# Patient Record
Sex: Female | Born: 1950 | Race: White | Hispanic: No | Smoking: Current every day smoker
Health system: Southern US, Community
[De-identification: ages and names within clinical notes are randomized; demographics above are authoritative.]

## PROBLEM LIST (undated history)

## (undated) DIAGNOSIS — J449 Chronic obstructive pulmonary disease, unspecified: Secondary | ICD-10-CM

## (undated) DIAGNOSIS — M797 Fibromyalgia: Secondary | ICD-10-CM

## (undated) DIAGNOSIS — E785 Hyperlipidemia, unspecified: Secondary | ICD-10-CM

## (undated) DIAGNOSIS — C801 Malignant (primary) neoplasm, unspecified: Secondary | ICD-10-CM

## (undated) DIAGNOSIS — I10 Essential (primary) hypertension: Secondary | ICD-10-CM

## (undated) DIAGNOSIS — F419 Anxiety disorder, unspecified: Secondary | ICD-10-CM

## (undated) HISTORY — DX: Fibromyalgia: M79.7

## (undated) HISTORY — PX: TONSILLECTOMY: SUR1361

## (undated) HISTORY — PX: BACK SURGERY: SHX140

## (undated) HISTORY — PX: MASTECTOMY: SHX3

## (undated) HISTORY — PX: BREAST SURGERY: SHX581

## (undated) HISTORY — PX: ABDOMINAL HYSTERECTOMY: SHX81

---

## 1998-06-23 ENCOUNTER — Emergency Department (HOSPITAL_COMMUNITY): Admission: EM | Admit: 1998-06-23 | Discharge: 1998-06-23 | Payer: Self-pay | Admitting: Emergency Medicine

## 1999-09-25 ENCOUNTER — Encounter: Admission: RE | Admit: 1999-09-25 | Discharge: 1999-09-25 | Payer: Self-pay | Admitting: General Practice

## 1999-09-25 ENCOUNTER — Encounter: Payer: Self-pay | Admitting: General Practice

## 2000-02-08 ENCOUNTER — Emergency Department (HOSPITAL_COMMUNITY): Admission: EM | Admit: 2000-02-08 | Discharge: 2000-02-08 | Payer: Self-pay | Admitting: *Deleted

## 2000-03-12 ENCOUNTER — Encounter: Admission: RE | Admit: 2000-03-12 | Discharge: 2000-03-12 | Payer: Self-pay | Admitting: Plastic Surgery

## 2000-03-12 ENCOUNTER — Encounter: Payer: Self-pay | Admitting: Plastic Surgery

## 2001-12-09 ENCOUNTER — Ambulatory Visit (HOSPITAL_COMMUNITY): Admission: RE | Admit: 2001-12-09 | Discharge: 2001-12-09 | Payer: Self-pay | Admitting: Anesthesiology

## 2001-12-09 ENCOUNTER — Encounter: Payer: Self-pay | Admitting: Anesthesiology

## 2003-08-31 ENCOUNTER — Encounter: Admission: RE | Admit: 2003-08-31 | Discharge: 2003-11-29 | Payer: Self-pay | Admitting: Anesthesiology

## 2003-12-19 ENCOUNTER — Encounter
Admission: RE | Admit: 2003-12-19 | Discharge: 2004-03-18 | Payer: Self-pay | Admitting: Physical Medicine and Rehabilitation

## 2004-01-17 ENCOUNTER — Inpatient Hospital Stay (HOSPITAL_COMMUNITY): Admission: RE | Admit: 2004-01-17 | Discharge: 2004-01-18 | Payer: Self-pay | Admitting: Orthopaedic Surgery

## 2006-09-14 ENCOUNTER — Other Ambulatory Visit: Admission: RE | Admit: 2006-09-14 | Discharge: 2006-09-14 | Payer: Self-pay | Admitting: Radiology

## 2008-07-14 ENCOUNTER — Emergency Department (HOSPITAL_COMMUNITY): Admission: EM | Admit: 2008-07-14 | Discharge: 2008-07-14 | Payer: Self-pay | Admitting: Emergency Medicine

## 2009-08-20 ENCOUNTER — Encounter: Payer: Self-pay | Admitting: Gastroenterology

## 2009-08-22 ENCOUNTER — Encounter: Payer: Self-pay | Admitting: Gastroenterology

## 2009-08-29 ENCOUNTER — Ambulatory Visit (HOSPITAL_COMMUNITY): Admission: RE | Admit: 2009-08-29 | Discharge: 2009-08-29 | Payer: Self-pay | Admitting: Gastroenterology

## 2009-08-29 ENCOUNTER — Ambulatory Visit: Payer: Self-pay | Admitting: Gastroenterology

## 2009-08-30 ENCOUNTER — Telehealth: Payer: Self-pay | Admitting: Gastroenterology

## 2010-02-13 ENCOUNTER — Encounter: Admission: RE | Admit: 2010-02-13 | Discharge: 2010-02-13 | Payer: Self-pay | Admitting: Family Medicine

## 2010-02-13 ENCOUNTER — Encounter: Admission: RE | Admit: 2010-02-13 | Discharge: 2010-02-13 | Payer: Self-pay | Admitting: Nephrology

## 2010-09-16 NOTE — Letter (Signed)
Summary: Internal Other/triage/instructions  Internal Other/triage/instructions   Imported By: Cloria Spring LPN 16/05/9603 54:09:81  _____________________________________________________________________  External Attachment:    Type:   Image     Comment:   External Document

## 2010-09-16 NOTE — Progress Notes (Signed)
Summary: Upper shoulder pain since TCS yesterday  Phone Note Call from Patient   Caller: Patient Summary of Call: pt called and said she had TCS yesterday. Later in the day yesterday  she had upper shoulder pain and all day today she has had the pain. When she leans back she describes the pain as excruciating. I told her I will let Dr. Darrick Penna know,  but if it is Excruciating, she might want to go on to the hospital. Her Number is [phone]. Initial call taken by: Cloria Spring LPN,  August 30, 2009 3:30 PM     Appended Document: Upper shoulder pain since TCS yesterday Pt instructed to go to ED.

## 2010-09-16 NOTE — Letter (Signed)
Summary: Internal Other/triage  Internal Other/triage   Imported By: Cloria Spring LPN 16/05/9603 54:09:81  _____________________________________________________________________  External Attachment:    Type:   Image     Comment:   External Document  Appended Document: Internal Other/triage Please call pt. TRILYTE prep. Needs propofol on Remeron, Cymbalta, and Xanax.  Appended Document: Internal Other/triage Pt is scheduled on 08/29/09 in the or

## 2010-11-02 LAB — BASIC METABOLIC PANEL
Calcium: 9.7 mg/dL (ref 8.4–10.5)
Chloride: 100 mEq/L (ref 96–112)
Creatinine, Ser: 0.84 mg/dL (ref 0.4–1.2)
GFR calc Af Amer: >60 mL/min (ref >60)
GFR calc non Af Amer: >60 mL/min (ref >60)
Glucose, Bld: 99 mg/dL (ref 70–99)
Potassium: 4.5 mEq/L (ref 3.5–5.1)
Sodium: 137 mEq/L (ref 135–145)

## 2010-11-02 LAB — HEMOGLOBIN AND HEMATOCRIT, BLOOD: Hemoglobin: 14.9 g/dL (ref 12.0–15.0)

## 2011-01-02 NOTE — Group Therapy Note (Signed)
Allison Park [facility] today as a referral  from Dr. Andi Devon.  She comes with records which I reviewed, imaging reports  which I reviewed.  She is complaining of cervical pain and paralumbar  discomfort.  She has been treated by Dr. Vear Clock, an exceptional pain  management physician in the community with adequate function and quality of  life indices, whom had been treating her with OxyContin, Soma in a  multimodality sense.  He tried interventional procedures, and these  medications, and she stated that she did not improve function and quality of  life indices.  She did, however, state the OxyContin helped with some of her  pain.  She relates a significantly impaired lifestyle secondary to  depression and her pain.  She is followed by a psychiatrist, Dr. Senaida Ores,  for bipolar disease.  She relates no suicidal ideation.  She relates no risk  to harm self or others, and she relates no previous substance abuse history.  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  She is also  taking Xanax for symptom control.  She does have some panic.  Her pain is an  8/10 subjective scale, dull, aching, throbbing, mostly 10/10.  She relates a  fairly sedentary lifestyle.  No overall neurological deficit.  She also  relates that she has been seen by Neurosurgery and been suggested as  nonsurgical.  She is an individual who has tired physical therapy and other  technologies as well which have not helped her.  She has not had any pain  medications in 8-10 days.  She had no signs of withdrawal.  Of note, she had  some type of problem in Dr. Berton Lan office, so I will try to call him.  She had, by last note, a drug screen which she states was fine.  We will try  to get that result.  Apparently, there was something about a missed  appointment, and he discharged her.   She states no temporal  relation.  Today, her pain is constant, dull, aching  and throbbing.  It is not improved with most activities.  It is made worse  with sitting, working, bending and walking.  She has not worked since  January of 1996.   MEDICATIONS:  1. Xanax.  2. Seroquel.  3. Lexapro.  4. Hydrochlorothiazide.  5. OxyContin.  6. Soma.   ALLERGIES:  Described as more intolerance to MORPHINE and other medications  that have been used for pain, and she is unclear of that inventory.  Tramadol she has taken and Darvocet she has taken.   PAST SURGICAL HISTORY:  1. Breast cancer surgery x7.  2. Total abdominal hysterectomy.  She is under the observation of Dr. Odis Luster     for her cancer.  She has no recurrence.  She has had MRI and x-rays.   SOCIAL HISTORY:  Remarkable for cigarette smoking, one half pack per day x5  years.  I cautioned.  She does not use alcohol.  She is married.   REVIEW OF SYSTEMS/FAMILY HISTORY/SOCIAL HISTORY:  Otherwise, noncontributory  to the pain problem.   PHYSICAL EXAMINATION:  GENERAL APPEARANCE:  A pleasant female, anxious,  appearing somewhat flat in affect, almost drugged.  Slow to respond.  Oriented x3.  Gait, affect, appearance, otherwise, unremarkable.  HEENT:  Unremarkable.  CHEST:  Clear to auscultation and percussion.  Regular rate and rhythm  without murmurs, rubs or gallops.  ABDOMEN:  Soft, nontender, benign.  No hepatosplenomegaly.  NEUROLOGICAL:  She has diffuse suprascapular, paracervical and paralumbar  myofascial discomfort.  Pain over PSI and notable pain on extension.  Gaenslen's and Patrick's equivocal.  She has no other overt neurological  deficits in motor, sensory and reflexes.   IMPRESSION:  1. Cervicalgia.  2. Degenerative spinal disease, cervical spine.  3. Degenerative spinal disease, lumbar spine.   PLAN:  1. She has been out of her medication for 8 days without decline in     function, quality of life indices, evidence of withdrawal or  other     functional impairment.  At this time, I am not going to reinitiate Soma     and will give her Ultracet for pain control with an occasional Wygesic     for breakthrough.  I will monitor usage patterns in appropriateness to     the patient's agreement.  2. Next visit will perform medical drug screen urine test.  I think she was     pretty much expecting it today, and this is with full informed consent,     and she understands we will be doing this from time to time.  She does     understand the patient care agreement.  3. Other lifestyle enhancement such as cigarette cessation, home based     therapy and increase in lifestyle activities are recommended.  4. Instructed to maintain contact with Dr. Senaida Ores.  5. Will follow up with her.  Will have our physiatry colleagues take a look     at her.  I would like to avoid controlled substances if possible.   This ends the consultation, discharge instructions given.  No barriers to  communication.     Celene Kras, MD   HH/MedQ  D:  09/04/2003 11:43:24  T:  09/04/2003 14:22:19  Job #:  [id number]

## 2011-01-02 NOTE — Assessment & Plan Note (Signed)
Allison Park is a 60 year old woman who I am seeing for the second time in  this clinic.  She was first seen October 24, 2003.  She has a history of  cervicalgia, bipolar disorder, a history of breast cancer, mild right  rotator cuff problem, low back pain and history of myofascial pain.   She has been treated with tramadol, Wygesic, and Soma; she feels that this  has not been that helpful for.  She also takes Skelaxin 800 t.i.d.   She reports that over the last 2-3 months she has developed this right  shoulder and upper extremity pain and in the last 4 weeks it has gotten much  worse for her.  She reports pain in her right shoulder and that goes down  her arm as well as into the right shoulder blade area.   She reports the right shoulder pain is worse in the morning, it is about a  10 on a scale of 0-10 and as the day goes on it lessens and at night when  she is resting she has absolutely no pain at all - a 0 at night.  She  reports some right hand tingling, especially in the morning.  She denies any  bowel or bladder problems, any gait abnormalities.   She remains rather functional.  She is independent with all of her  activities of daily living and self-care and in fact she does the laundry  and does meal prep and some grocery shopping.   On exam she is alert, oriented as well as cooperative and appropriate.   Blood pressure is 115/77, pulse 75, respirations 16, 99% saturated on room  air.  Her heart is in regular rhythm.  Lungs are clear.  Abdomen benign.   She has functional range of motion at her shoulders.  She has some mild to  moderate limitations with her cervical range.  Her reflexes are intact in  the upper and lower extremities, 2+ at the biceps, triceps, and  brachioradialis, 2+ at the knees and ankles bilaterally, toes are downgoing,  no clonus is noted.  She has some increased pain to pinprick over the right  C5 dermatome, sensation is otherwise intact throughout both  upper and lower  extremities.   IMPRESSION:  1. Cervicalgia.  2. Right upper extremity radicular symptoms.  3. History of bipolar disorder.  4. History of breast cancer.  5. Low back pain.  6. History of myofascial pain.   PLAN:  We will give her Norco 10/325 one p.o. every morning (#30).  We will  also give her something to help her rest at night.  She says the Tresa Garter has  been very helpful for her in the past, we will give her 350 mg one p.o. at  bedtime (#30).  She tells me she is planning to follow up with Sharolyn Douglas and  that she will be seeing him within the next week.  Would consider either  flexion and extension films or possibly an MRI, apparently it has been a  while  since she had repeat cervical scanning however, since she is going to be  following up with Dr. Noel Gerold we will hold off and wait for his assessment on  her.  We will see her back in 1 month.      Brantley Stage, M.D.   DMK/MedQ  D:  11/23/2003 17:54:09  T:  11/24/2003 20:16:41  Job #:  [id number]   cc:   Sharolyn Douglas, M.D.  [phone]  [address]  [place]  [place] [address]  Fax: [phone]   Gabriel Earing, M.D.  [address]  [place]  [place] [address]  Fax: [phone]

## 2011-01-02 NOTE — H&P (Signed)
NAME:  Allison Park, Allison Park                          ACCOUNT NO.:  [id number]   MEDICAL RECORD NO.:  [id number]                   PATIENT TYPE:  INP   LOCATION:  NA                                   FACILITY:  [facility]   PHYSICIAN:  Sharolyn Douglas, M.D.                     DATE OF BIRTH:  Feb 07, 1951   DATE OF ADMISSION:  01/17/2004  DATE OF DISCHARGE:                                HISTORY & PHYSICAL   CHIEF COMPLAINT:  Pain in my neck and right arm.   PRESENT ILLNESS:  This 60 year old lady has had pain in her neck and right  upper extremity going on since the early part of this year, in February.  The pain is primarily into the posterior shoulder in the peri-scapular area.  It radiates down into the right arm and hand.  She has aching, numbness and  burning.  Exercise exacerbates her discomfort, as well as coughing and  sneezing.  She only has minimal relief of discomfort with analgesics and  muscle relaxants.  This patient has had multiple surgeries in the past and  is very reticent to have any surgery, however, she is essentially at her  wit's end as far as her discomfort.  She is being [facility].  Conservative  treatment has not helped her overall discomfort.  It was felt she would  benefit from surgical intervention and is being admitted for anterior  cervical diskectomy and fusion,  C5-C6, C6-C7, with allograft and plate.  MRIs have shown cervical spondylitic changes; this is mostly seen at C5-C6.  Broad-based disk protrusion is seen there at this same level with stenosis.  C6-C7 has shown stenosis as well and foraminal disk protrusion to the right.   PAST MEDICAL HISTORY:  This lady has been in relatively good health.  She  does have severe migraine headaches and anxiety and depression as well.  She  also has hypertension.   PAST SURGICAL HISTORY:  Past surgeries include mastectomies in 1990 and 1991  with  reconstruction, breast infections in 1996 and 1997 and final breast  reconstructions in 1998 and 1999.   SOCIAL HISTORY:  The patient is married.  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  Her husband will be her care-giver after surgery.   FAMILY HISTORY:  Family history is positive for hypertension, arthritis and  pre-cancerous cells.   REVIEW OF SYSTEMS:  CNS:  No seizure disorder, paralysis or double vision,  but the patient has a radiculitis into the right upper extremity as  mentioned above.  CARDIOVASCULAR:  No chest pain, no angina, no orthopnea.  RESPIRATORY:  No productive cough, no hemoptysis, no shortness of breath.  GASTROINTESTINAL:  No nausea, vomiting, melena or bloody stools.  GENITOURINARY:  No discharge, dysuria or hematuria.  MUSCULOSKELETAL:  Primarily in present illness.   PHYSICAL EXAMINATION:  GENERAL/VITAL SIGNS:  Alert, cooperative, friendly,  somewhat apprehensive 60 year old white female whose vital signs are:  Blood  pressure 118/82, pulse 90, respirations are 12.  HEENT:  Normocephalic.  PERRLA; wears glasses.  Oropharynx is clear.  CHEST:  The chest was clear to auscultation, no rhonchi, no rales.  HEART:  Heart with regular rate and rhythm.  No murmurs are heard.  ABDOMEN:  Abdomen is soft and nontender, liver and spleen not felt.  GENITAL, RECTAL, PELVIC, BREASTS:  Not done, not pertinent to present  illness.  EXTREMITIES:  She has decreased sensation in the right upper extremity in a  C6-7 distribution.  Strength, 4/5, is seen in the biceps.   ADMITTING DIAGNOSES:  1. Cervical spondylitic radiculopathy.  2. Anxiety and depression.  3. Hypertension.   PLAN:  The patient will be admitted for anterior cervical disk fusion at C5-  C6 and C6-C7 with allograft and plate.      Dooley L. Cherlynn June.                 Sharolyn Douglas, M.D.    DLU/MEDQ  D:  01/09/2004  T:  01/10/2004  Job:   [id number]   cc:   Estell Harpin, M.D.  [address]  [place] [address]  Fax: [phone]   Brantley Stage, M.D.  Fax: [phone]

## 2011-01-02 NOTE — Op Note (Signed)
NAMEAMORA, SHEEHY                          ACCOUNT NO.:  [id number]   MEDICAL RECORD NO.:  [id number]                   PATIENT TYPE:  INP   LOCATION:  2870                                 FACILITY:  [facility]   PHYSICIAN:  Sharolyn Douglas, M.D.                     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   PROCEDURE:  1. Anterior cervical diskectomy C5-6 and C6-7.  2. Anterior cervical arthrodesis C5-6 and C6-7, with placement of two     allograft prosthesis spacers packed with local autogenous bone graft.  3. Anterior cervical plating at C5 though C7, utilizing the Spinal Concept     System.   SURGEON:  Sharolyn Douglas, M.D.   ASSISTANT:  Verlin Fester, P.A.   ANESTHESIA:  General endotracheal.   COMPLICATIONS:  None.   INDICATIONS FOR PROCEDURE:  The patient is a 60 year old female with a  history of severe neck and right upper extremity pain, thought to be  secondary to cervical spondylitic radiculopathy.  Plain radiographs showed  degenerative changes, most severe at C5-6 and C6-7 with loss of the normal  lordosis.  An MRI scan shows moderate-sized broad-based disk protrusion and  spinal stenosis at C5-6 with foraminal narrowing, right greater than left.  At C6-7 there is moderate spinal stenosis with a focal right foraminal disk  protrusion with right-sided foraminal narrowing.  She has milder  degenerative changes at C4-5, with a small central disk bulge.  Because of  her persistent symptoms, unresponsive to all conservative care, she has  elected to undergo an ACDF at C5-6, C6-7, in hopes of improving her  symptoms.  She knows the risks of adjacent segment degeneration requiring  additional surgery.   DESCRIPTION OF PROCEDURE:  The patient is properly identified in the holding  area and taken to the operating  room.  She underwent general endotracheal  anesthesia without difficulty.  She was given prophylactic antibiotics.  She  was carefully positioned supine on the operating room table with the  Mayfield headrest.  Her neck was placed in slight extension with 5 pounds of  Holter traction placed.  The neck was prepped and draped in the usual  sterile fashion.  A 4.0 cm incision was made on the left side in a  transverse fashion at the level of the cricoid cartilage.  Dissection was  carried sharply through the platysma.  The interval between the SCM and the  strap muscles medially was developed down the prevertebral space.  The  esophagus, trachea and carotid sheath were identified and protected at all  times.  A spinal needle was placed in the C6-7 interspace.  X-ray taken to  confirm this location.  The longus coli muscle was  elevated out over the C5-  6 and C6-7 disk spaces bilaterally.  A deep ShadowLine retractor was placed.  A sharp annulotomy was carried out.  The diskectomy completed back to the  posterior longitudinal ligament.  We found the disk to be degenerative at  both levels.  There were uncovertebral spurs at C5-6, greater than C6-7.  Caspar distraction pins were placed in the C5, C6 and C7 vertebral bodies.  The microscope was draped and brought into the field.  A high-speed bur was  used to remove the cartilaginous end plates and take down the uncovertebral  spurs and posterior vertebral margins.  A 2.0 mm Kerrison punch was used to  undercut the vertebral margins.  Complete wide foraminotomies bilaterally at  both levels.  A disk herniation was removed from the spinal canal and C6-7  foramen on the right side.  We felt like we had a good decompression of the  thecal sac and nerve roots bilaterally at both levels.  We then placed a 7.0  mm invasive allograft prosthesis spacer packed with local autogenous bone  graft collected from the bur shavings at C5-6.  This was carefully   countersunk 1.0 mm.  An 8.0 mm graft placed at C6-7, again countersunk 1.0  mm and packed with local autogenous bone graft.  A 42.0 mm Spinal Concept  anterior cervical plate placed with six 12.0 mm screws.  We had good screw  purchase.  The locking mechanism was engaged.  The wound was irrigated.  The  esophagus, trachea and carotid sheath were inspected.  There were no  apparent injuries.  Bleeding was controlled with bipolar electrocautery,  Gelfoam, and a deep Penrose drain left in place.  The platysma was closed  with #2-0 interrupted Vicryl, the subcutaneous layer closed with #3-0  Vicryl, followed by a running #4-0 subcuticular suture to approximate the  skin edges.  Benzoin and Steri-Strips placed.  A soft collar applied.  The patient was extubated without difficulty and transferred to the recovery  room in stable condition, able to move her upper and lower extremities.                                               Sharolyn Douglas, M.D.    MC/MEDQ  D:  01/17/2004  T:  01/17/2004  Job:  [id number]

## 2011-01-02 NOTE — Group Therapy Note (Signed)
CHIEF COMPLAINT:  Neck, back, and right shoulder pain.   This is a 60 year old woman who has a history of neck pain dating back to  1998 and recent onset of low back pain dating back to about a year ago and  right shoulder pain.  She has a previous history of a rotator cuff injury.  She reports that the neck, back, and shoulder are all approximately a 10 on  a scale of 0-10.  It can go down to approximately a 7.  She has been seen in  the past by Dr. Vear Clock who took care of her pain management over the last  year.  She has also seen Dr. Stevphen Rochester who has recently seen her on September 04, 2003.   She is also following up with Dr. Senaida Ores who is her psychiatrist and  takes care of her for her bipolar disease.   Her pain is typically exacerbated by increased activity.  Her right shoulder  flared up when she was doing the laundry.  She has flare ups in the right  shoulder approximately every two to three months and they usually last  approximately a week or so.  Her neck and low back pain are exacerbated by  activities as well.  Apparently she has been told by a previous physician  that she should not do any kind of exercise and that any kind of exercise  would be detrimental to her condition.   She denies any problems with weakness.  She denies any bowel or bladder  symptoms.  She denies any persistent numbness and tingling.  Occasionally  she gets some tingling and numbness into the left lower extremity.  It is  not persistent.  It happens a couple of times a week.  She has no problems  with clumsiness or falls.  She describes the pain as achy and dull in the  neck and the mid back area.  She also mentions briefly that her knees do  bother her as well.   She apparently has some previous MRI scans of her neck, back, and right  shoulder.  I do not have any information regarding these.  She tells me they  have been done within the last year.   FUNCTIONAL HISTORY:  The patient is  independent with all of her activities  of daily living and mobility.   REVIEW OF SYSTEMS:  The nursing assessment health and history form are  reviewed.  The patient denies any cardiovascular or respiratory problems and  any gastrointestinal or genitourinary problems.  Denies any constitutional  problems, such as fevers, chills, weight loss, and swelling.   EMOTIONAL STATUS:  The patient currently reports she is doing well.  She is  followed up by Leone Haven who is her psychiatrist.  She has been  seeing her approximately one year and she takes care of her for her  depression and bipolar disease.   She is married.  She denies alcohol use.  Denies illicit drug use.  She  smokes a packs of cigarettes per day.  She also has two dogs which she  enjoys taking care of.   Her mom is deceased at age 44 with rheumatic heart disease.  Father alive at  74 with history of ulcers, otherwise healthy.  One brother, 11, currently  healthy.   ALLERGIES:  PENICILLIN.   MEDICATIONS:  1. Hydrochlorothiazide one daily.  2. Lexapro one daily.  3. Seroquel one-half b.i.d.  4. Xanax 60 mg q.i.d.  5.  Skelaxin 800 mg one t.i.d.  6. Tramadol 50 mg one to two q.i.d.  7. Lidoderm 5%.  8. Wygesic one b.i.d.   PAST MEDICAL HISTORY:  Remarkable for:  1. Depression.  2. Bipolar disease.  3. Breast cancer, status post breast implants and reconstruction.  4. Low back pain.  5. Cervicalgia.  6. Rotator cuff problems.   PAST SURGICAL HISTORY:  1. Breast cancer surgery x 7.  2. Total abdominal hysterectomy.   The patient smokes a packs of cigarettes per day.  Denies illicit drug use.  Denies alcohol use.   PHYSICAL EXAMINATION:  VITAL SIGNS:  On exam today, the blood pressure is  126/80, pulse 99, respirations 16, 96% saturated on room air.  GENERAL APPEARANCE:  She was a pleasant, well-developed, well-nourished  woman in no apparent distress.  Somewhat flat in her affect, but cooperative  and  appropriate.  HEENT:  Unremarkable.  The face was symmetric.  NECK:  No adenopathy noted.  Some limitations in range of flexion,  extension, and lateral flexion, as well as rotation.  The patient noticed  increased discomfort, especially with extension.  HEART:  With regular rhythm.  LUNGS:  Clear overall with initially a couple of wheezes which cleared.  ABDOMEN:  Benign.  Bowel sounds are positive.  NEUROMUSCULAR:  Cranial nerves are grossly intact.  Sensory exam does not  reveal any deficits with light touch.  Vibratory and position sense were  intact.  Romberg's test was normal.  Her gait was normal in the room.  She  had some limitations with forward flexion, extension, and lateral flexion.  All increased pain in her lower lumbar area.  Reflexes in the upper and  lower extremities were 2+ and symmetric at biceps, triceps, brachial  radialis, patellar, and Achilles' tendons.  Toes are downgoing to the  plantar response.  No clonus was noted.  She had full shoulder range of  motion bilaterally.  She did report increased pain in the right shoulder  with active range of motion.  Her passive range of motion, as well as active  was quite functional.  Motor strength in both upper and lower extremities  was in the 5/5 range.  There was really no focal weakness noted.  She had  tenderness to palpation in the lower cervical paraspinal musculature, as  well as in the lumbar paraspinal musculature.  She had some mild bicipital  tendon tenderness as well.   ASSESSMENT:  1. Cervicalgia.  2. Mild right shoulder rotator cuff tendonitis.  3. Low back pain.  4. Cervical and lumbar spondylosis.  5. Myofascial pain.  6. Depression.  7. Bipolar disease.  8. History of breast cancer.   PLAN:  Will continue using tramadol, Lidoderm, and Wygesic.  Will continue  to avoid stronger opioids.  She was given a two-week supply of Soma which she can take once a night to help her relax.  Her shoulder has  been  bothering her somewhat.  She found that in the past Soma was quite effective  in helping her rest.  We will not plan on keeping her on this medication for  any long duration.  She was also given Lidoderm samples and no prescriptions  other than the Soma one p.o. q.h.s. p.r.n., #14.  That was the only  prescription written today.  She was also given information regarding the  fibromyalgia and arthritis pool programs at the [facility].  She said she  would be interested in engaging in this particular  activity.     Brantley Stage, M.D.   DMK/MedQ  D:  10/24/2003 14:59:14  T:  10/24/2003 16:20:18  Job #:  [id number]   cc:   Gabriel Earing, M.D.  [address]  [place]  [place] [address]  Fax: [phone]

## 2012-06-17 ENCOUNTER — Ambulatory Visit
Admission: RE | Admit: 2012-06-17 | Discharge: 2012-06-17 | Disposition: A | Discharge status: Home / Self Care | Payer: 59 | Source: Ambulatory Visit | Attending: Nephrology | Admitting: Nephrology

## 2012-06-17 ENCOUNTER — Other Ambulatory Visit: Payer: Self-pay | Admitting: Nephrology

## 2012-07-07 ENCOUNTER — Encounter (HOSPITAL_COMMUNITY): Payer: Self-pay | Admitting: *Deleted

## 2012-07-07 ENCOUNTER — Emergency Department (HOSPITAL_COMMUNITY)
Admission: EM | Admit: 2012-07-07 | Discharge: 2012-07-07 | Disposition: A | Discharge status: Home / Self Care | Payer: 59 | Attending: Emergency Medicine | Admitting: Emergency Medicine

## 2012-07-07 DIAGNOSIS — L0291 Cutaneous abscess, unspecified: Secondary | ICD-10-CM

## 2012-07-07 DIAGNOSIS — I10 Essential (primary) hypertension: Secondary | ICD-10-CM | POA: Insufficient documentation

## 2012-07-07 DIAGNOSIS — Z79899 Other long term (current) drug therapy: Secondary | ICD-10-CM | POA: Insufficient documentation

## 2012-07-07 DIAGNOSIS — F17200 Nicotine dependence, unspecified, uncomplicated: Secondary | ICD-10-CM | POA: Insufficient documentation

## 2012-07-07 DIAGNOSIS — N764 Abscess of vulva: Secondary | ICD-10-CM | POA: Insufficient documentation

## 2012-07-07 DIAGNOSIS — Z853 Personal history of malignant neoplasm of breast: Secondary | ICD-10-CM | POA: Insufficient documentation

## 2012-07-07 HISTORY — DX: Malignant (primary) neoplasm, unspecified: C80.1

## 2012-07-07 HISTORY — DX: Essential (primary) hypertension: I10

## 2012-07-07 LAB — GLUCOSE, CAPILLARY: Glucose-Capillary: 95 mg/dL (ref 70–99)

## 2012-07-07 MED ORDER — DOXYCYCLINE HYCLATE 100 MG PO CAPS: 100.0000 mg | ORAL_CAPSULE | Freq: Two times a day (BID) | ORAL | Status: DC

## 2012-07-07 MED ORDER — OXYCODONE-ACETAMINOPHEN 5-325 MG PO TABS
1.0000 | ORAL_TABLET | Freq: Once | ORAL | Status: AC
  Administered 2012-07-07: 1 via ORAL
  Filled 2012-07-07: qty 1

## 2012-07-07 NOTE — ED Provider Notes (Signed)
History     CSN: [id number]  Arrival date & time 07/07/12  1353   First MD Initiated Contact with Patient 07/07/12 1423      Chief Complaint  Patient presents with  . Abscess     Patient is a 61 y.o. female presenting with abscess. The history is provided by the patient.  Abscess  This is a recurrent problem. The current episode started more than one week ago. The onset was gradual. The problem occurs frequently. The problem has been gradually improving. Affected Location: left labia. Pertinent negatives include no fever and no vomiting.  pt reports she has had abscess in left labia for past month.  She reports she "popped" it earlier this month but still reports swelling in the area.  She saw her PCP last week who advised ER evaluation but did not come in until today   No fever/vomiting.  No abd pain.  No h/o diabetes  Past Medical History  Diagnosis Date  . Hypertension   . Cancer     Past Surgical History  Procedure Date  . Breast surgery   . Mastectomy     No family history on file.  History  Substance Use Topics  . Smoking status: Current Every Day Smoker -- 0.5 packs/day    Types: Cigarettes  . Smokeless tobacco: Not on file  . Alcohol Use: No    OB History    Grav Para Term Preterm Abortions TAB SAB Ect Mult Living                  Review of Systems  Constitutional: Negative for fever.  Gastrointestinal: Negative for vomiting.    Allergies  Penicillins  Home Medications   Current Outpatient Rx  Name  Route  Sig  Dispense  Refill  . ALPRAZOLAM 1 MG PO TABS   Oral   Take 1 mg by mouth 3 (three) times daily as needed. Anxiety.         Marland Kitchen FLUTICASONE-SALMETEROL 500-50 MCG/DOSE IN AEPB   Inhalation   Inhale 1 puff into the lungs every 12 (twelve) hours.         Marland Kitchen HYDROCHLOROTHIAZIDE 25 MG PO TABS   Oral   Take 25 mg by mouth daily.         Marland Kitchen NAPROXEN SODIUM 220 MG PO TABS   Oral   Take 220 mg by mouth 2 (two) times daily as needed.  Headache.         Marland Kitchen SIMVASTATIN 40 MG PO TABS   Oral   Take 40 mg by mouth every evening.         Marland Kitchen TEMAZEPAM 15 MG PO CAPS   Oral   Take 15 mg by mouth at bedtime as needed. Sleep.         . VENLAFAXINE HCL ER 150 MG PO CP24   Oral   Take 150 mg by mouth daily.         Marland Kitchen DOXYCYCLINE HYCLATE 100 MG PO CAPS   Oral   Take 1 capsule (100 mg total) by mouth 2 (two) times daily.   10 capsule   0     BP 129/92  Pulse 95  Temp 97.5 F (36.4 C) (Oral)  Resp 18  Ht 5\' 4"  (1.626 m)  Wt 140 lb (63.504 kg)  BMI 24.03 kg/m2  SpO2 97%  Physical Exam CONSTITUTIONAL: Well developed/well nourished HEAD AND FACE: Normocephalic/atraumatic EYES: EOMI/PERRL ENMT: Mucous membranes moist NECK: supple no meningeal signs  CV: S1/S2 noted, no murmurs/rubs/gallops noted LUNGS: Lungs are clear to auscultation bilaterally, no apparent distress ABDOMEN: soft, nontender, no rebound or guarding GU:no cva tenderness. Lateral to left labia is a small area of erythema but no fluctuance.  Small amt of induration.  Minimal tenderness noted.  No other abscesses noted.  No vaginal fistula.  Chaperone present NEURO: Pt is awake/alert, moves all extremitiesx4 EXTREMITIES: pulses normal, full ROM SKIN: warm, color normal PSYCH: no abnormalities of mood noted  ED Course  Procedures   Area is already healing and does not require any drainage.  Will start short course of doxycycline since she has small amt of residual erythema and induration.  One dose of pain meds given here.   Labs Reviewed  GLUCOSE, CAPILLARY     1. Abscess       MDM  Nursing notes including past medical history and social history reviewed and considered in documentation Labs/vital reviewed and considered - no signs of diabetes         Joya Gaskins, MD 07/07/12 1458

## 2012-07-07 NOTE — ED Notes (Signed)
Patient with no complaints at this time. Respirations even and unlabored. Skin warm/dry. Discharge instructions reviewed with patient at this time. Patient given opportunity to voice concerns/ask questions. Patient discharged at this time and left Emergency Department with steady gait.   

## 2012-07-07 NOTE — ED Notes (Signed)
Pt with abscess to left labia x 1 month, states one part ruptured and the other part did not

## 2012-07-13 ENCOUNTER — Emergency Department (HOSPITAL_COMMUNITY)
Admission: EM | Admit: 2012-07-13 | Discharge: 2012-07-13 | Disposition: A | Discharge status: Home / Self Care | Payer: 59 | Attending: Emergency Medicine | Admitting: Emergency Medicine

## 2012-07-13 ENCOUNTER — Encounter (HOSPITAL_COMMUNITY): Payer: Self-pay | Admitting: *Deleted

## 2012-07-13 DIAGNOSIS — F17200 Nicotine dependence, unspecified, uncomplicated: Secondary | ICD-10-CM | POA: Insufficient documentation

## 2012-07-13 DIAGNOSIS — I10 Essential (primary) hypertension: Secondary | ICD-10-CM | POA: Insufficient documentation

## 2012-07-13 DIAGNOSIS — E785 Hyperlipidemia, unspecified: Secondary | ICD-10-CM | POA: Insufficient documentation

## 2012-07-13 DIAGNOSIS — J4489 Other specified chronic obstructive pulmonary disease: Secondary | ICD-10-CM | POA: Insufficient documentation

## 2012-07-13 DIAGNOSIS — N9089 Other specified noninflammatory disorders of vulva and perineum: Secondary | ICD-10-CM | POA: Insufficient documentation

## 2012-07-13 DIAGNOSIS — J449 Chronic obstructive pulmonary disease, unspecified: Secondary | ICD-10-CM | POA: Insufficient documentation

## 2012-07-13 DIAGNOSIS — Z859 Personal history of malignant neoplasm, unspecified: Secondary | ICD-10-CM | POA: Insufficient documentation

## 2012-07-13 DIAGNOSIS — IMO0002 Reserved for concepts with insufficient information to code with codable children: Secondary | ICD-10-CM

## 2012-07-13 DIAGNOSIS — Z79899 Other long term (current) drug therapy: Secondary | ICD-10-CM | POA: Insufficient documentation

## 2012-07-13 HISTORY — DX: Chronic obstructive pulmonary disease, unspecified: J44.9

## 2012-07-13 HISTORY — DX: Hyperlipidemia, unspecified: E78.5

## 2012-07-13 MED ORDER — DOXYCYCLINE HYCLATE 100 MG PO CAPS: 100.0000 mg | ORAL_CAPSULE | Freq: Two times a day (BID) | ORAL | Status: DC

## 2012-07-13 MED ORDER — HYDROCODONE-ACETAMINOPHEN 5-325 MG PO TABS: 1.0000 | ORAL_TABLET | Freq: Four times a day (QID) | ORAL | Status: DC | PRN

## 2012-07-13 MED ORDER — DOXYCYCLINE HYCLATE 100 MG PO TABS
100.0000 mg | ORAL_TABLET | Freq: Once | ORAL | Status: AC
  Administered 2012-07-13: 100 mg via ORAL
  Filled 2012-07-13: qty 1

## 2012-07-13 MED ORDER — HYDROCODONE-ACETAMINOPHEN 5-325 MG PO TABS
1.0000 | ORAL_TABLET | Freq: Once | ORAL | Status: AC
  Administered 2012-07-13: 1 via ORAL
  Filled 2012-07-13: qty 1

## 2012-07-13 NOTE — ED Notes (Signed)
Abscess to groin area, seen here 11/21 for same. Pt says is not getting better and running a fever.  Has been taking antibiotic but says is not better.

## 2012-07-13 NOTE — ED Provider Notes (Signed)
History     CSN: [id number]  Arrival date & time 07/13/12  1227   First MD Initiated Contact with Patient 07/13/12 1253      Chief Complaint  Patient presents with  . Abscess    (Consider location/radiation/quality/duration/timing/severity/associated sxs/prior treatment) HPI Comments: Pt completed a 10 day regimen of doxycycline yest.  Initially there "was a hole in the L groin".  The area has improved but she is still having pain and the skin is still open slightly.  Patient is a 61 y.o. female presenting with abscess. The history is provided by the patient. No language interpreter was used.  Abscess  This is a new problem. The onset was sudden. The problem has been gradually improving. Affected Location: L groin. The problem is moderate. The abscess is characterized by painfulness. The abscess first occurred at home. Pertinent negatives include no fever. Recently, medical care has been given at this facility. Services received include medications given.    Past Medical History  Diagnosis Date  . Hypertension   . Cancer   . Hyperlipemia   . COPD (chronic obstructive pulmonary disease)     Past Surgical History  Procedure Date  . Mastectomy   . Breast surgery   . Breast surgery   . Abdominal hysterectomy     History reviewed. No pertinent family history.  History  Substance Use Topics  . Smoking status: Current Every Day Smoker -- 0.5 packs/day    Types: Cigarettes  . Smokeless tobacco: Not on file  . Alcohol Use: No    OB History    Grav Para Term Preterm Abortions TAB SAB Ect Mult Living                  Review of Systems  Constitutional: Negative for fever.  Genitourinary: Negative for dysuria, vaginal discharge and vaginal pain.  All other systems reviewed and are negative.    Allergies  Penicillins  Home Medications   Current Outpatient Rx  Name  Route  Sig  Dispense  Refill  . ALPRAZOLAM 1 MG PO TABS   Oral   Take 1 mg by mouth 3 (three)  times daily as needed. Anxiety.         Marland Kitchen DOXYCYCLINE HYCLATE 100 MG PO CAPS   Oral   Take 1 capsule (100 mg total) by mouth 2 (two) times daily.   10 capsule   0   . DOXYCYCLINE HYCLATE 100 MG PO CAPS   Oral   Take 1 capsule (100 mg total) by mouth 2 (two) times daily.   10 capsule   0   . FLUTICASONE-SALMETEROL 500-50 MCG/DOSE IN AEPB   Inhalation   Inhale 1 puff into the lungs every 12 (twelve) hours.         Marland Kitchen HYDROCHLOROTHIAZIDE 25 MG PO TABS   Oral   Take 25 mg by mouth daily.         Marland Kitchen HYDROCODONE-ACETAMINOPHEN 5-325 MG PO TABS   Oral   Take 1 tablet by mouth every 6 (six) hours as needed for pain.   20 tablet   0   . NAPROXEN SODIUM 220 MG PO TABS   Oral   Take 220 mg by mouth 2 (two) times daily as needed. Headache.         Marland Kitchen SIMVASTATIN 40 MG PO TABS   Oral   Take 40 mg by mouth every evening.         Marland Kitchen TEMAZEPAM 15 MG PO CAPS  Oral   Take 15 mg by mouth at bedtime as needed. Sleep.         . VENLAFAXINE HCL ER 150 MG PO CP24   Oral   Take 150 mg by mouth daily.           BP 126/73  Pulse 95  Temp 98.1 F (36.7 C) (Oral)  Resp 20  Ht 5\' 4"  (1.626 m)  Wt 140 lb (63.504 kg)  BMI 24.03 kg/m2  SpO2 96%  Physical Exam  Nursing note and vitals reviewed. Constitutional: She is oriented to person, place, and time. She appears well-developed and well-nourished. No distress.  HENT:  Head: Normocephalic and atraumatic.  Eyes: EOM are normal.  Neck: Normal range of motion.  Cardiovascular: Normal rate, regular rhythm and normal heart sounds.   Pulmonary/Chest: Effort normal and breath sounds normal.  Abdominal: Soft. She exhibits no distension. There is no tenderness.  Genitourinary:     Musculoskeletal: Normal range of motion.  Neurological: She is alert and oriented to person, place, and time.  Skin: Skin is warm and dry.  Psychiatric: She has a normal mood and affect. Judgment normal.    ED Course  Procedures (including  critical care time)  Labs Reviewed - No data to display No results found.   1. Inguinal cyst       MDM  rx-doxycyclne, 10 rx-hydrocodone, 20 Warm compresses F/u with dr. Emelda Fear.        Evalina Field, [place] 07/13/12 [phone]

## 2012-07-13 NOTE — ED Provider Notes (Signed)
Medical screening examination/treatment/procedure(s) were performed by non-physician practitioner and as supervising physician I was immediately available for consultation/collaboration.   Denzal Meir, MD 07/13/12 1445 

## 2012-07-21 ENCOUNTER — Encounter (HOSPITAL_COMMUNITY): Payer: Self-pay | Admitting: Emergency Medicine

## 2012-07-21 ENCOUNTER — Emergency Department (HOSPITAL_COMMUNITY)
Admission: EM | Admit: 2012-07-21 | Discharge: 2012-07-21 | Disposition: A | Discharge status: Home / Self Care | Payer: 59 | Attending: Emergency Medicine | Admitting: Emergency Medicine

## 2012-07-21 ENCOUNTER — Emergency Department (HOSPITAL_COMMUNITY): Payer: 59

## 2012-07-21 DIAGNOSIS — Z859 Personal history of malignant neoplasm, unspecified: Secondary | ICD-10-CM | POA: Insufficient documentation

## 2012-07-21 DIAGNOSIS — E785 Hyperlipidemia, unspecified: Secondary | ICD-10-CM | POA: Insufficient documentation

## 2012-07-21 DIAGNOSIS — L03319 Cellulitis of trunk, unspecified: Secondary | ICD-10-CM | POA: Insufficient documentation

## 2012-07-21 DIAGNOSIS — I10 Essential (primary) hypertension: Secondary | ICD-10-CM | POA: Insufficient documentation

## 2012-07-21 DIAGNOSIS — J4489 Other specified chronic obstructive pulmonary disease: Secondary | ICD-10-CM | POA: Insufficient documentation

## 2012-07-21 DIAGNOSIS — J449 Chronic obstructive pulmonary disease, unspecified: Secondary | ICD-10-CM | POA: Insufficient documentation

## 2012-07-21 DIAGNOSIS — L02219 Cutaneous abscess of trunk, unspecified: Secondary | ICD-10-CM | POA: Insufficient documentation

## 2012-07-21 DIAGNOSIS — L02214 Cutaneous abscess of groin: Secondary | ICD-10-CM

## 2012-07-21 DIAGNOSIS — Z79899 Other long term (current) drug therapy: Secondary | ICD-10-CM | POA: Insufficient documentation

## 2012-07-21 DIAGNOSIS — F17200 Nicotine dependence, unspecified, uncomplicated: Secondary | ICD-10-CM | POA: Insufficient documentation

## 2012-07-21 MED ORDER — OXYCODONE-ACETAMINOPHEN 5-325 MG PO TABS: 1.0000 | ORAL_TABLET | Freq: Four times a day (QID) | ORAL | Status: AC | PRN

## 2012-07-21 MED ORDER — MELOXICAM 7.5 MG PO TABS: ORAL_TABLET | ORAL | Status: DC

## 2012-07-21 MED ORDER — IBUPROFEN 800 MG PO TABS
800.0000 mg | ORAL_TABLET | Freq: Once | ORAL | Status: AC
  Administered 2012-07-21: 800 mg via ORAL
  Filled 2012-07-21: qty 1

## 2012-07-21 MED ORDER — DOXYCYCLINE HYCLATE 100 MG PO CAPS: 100.0000 mg | ORAL_CAPSULE | Freq: Two times a day (BID) | ORAL | Status: AC

## 2012-07-21 MED ORDER — OXYCODONE-ACETAMINOPHEN 5-325 MG PO TABS
1.0000 | ORAL_TABLET | Freq: Once | ORAL | Status: AC
  Administered 2012-07-21: 1 via ORAL
  Filled 2012-07-21: qty 1

## 2012-07-21 NOTE — ED Notes (Signed)
Pt presents with abscess to left upper thigh. Pt was seen here 11/27 and given abx for tx. Pt reports no change in site. Pt denies drainage from area.

## 2012-07-21 NOTE — ED Provider Notes (Signed)
History     CSN: [id number]  Arrival date & time 07/21/12  1457   First MD Initiated Contact with Patient 07/21/12 1532      Chief Complaint  Patient presents with  . Abscess    (Consider location/radiation/quality/duration/timing/severity/associated sxs/prior treatment) HPI Comments: Seen here ~ 2 weeks ago after  An area in her L groin drained for one day.  There was no identifiable abscess then to I&D.  She just completed 10 days of doxycycline and states there has been no improvement.  No fever or chills.  Pt of dr. Bascom Levels in [place]   Patient is a 61 y.o. female presenting with abscess. The history is provided by the patient. No language interpreter was used.  Abscess  This is a new problem. Episode onset: ~ 2 weeks ago. The problem has been unchanged. Affected Location: L groin. The problem is moderate. The abscess is characterized by painfulness. The abscess first occurred at home. Pertinent negatives include no fever. There were no sick contacts. Recently, medical care has been given at this facility.    Past Medical History  Diagnosis Date  . Hypertension   . Cancer   . Hyperlipemia   . COPD (chronic obstructive pulmonary disease)     Past Surgical History  Procedure Date  . Mastectomy   . Breast surgery   . Breast surgery   . Abdominal hysterectomy   . Back surgery     History reviewed. No pertinent family history.  History  Substance Use Topics  . Smoking status: Current Every Day Smoker -- 0.5 packs/day    Types: Cigarettes  . Smokeless tobacco: Not on file  . Alcohol Use: No    OB History    Grav Para Term Preterm Abortions TAB SAB Ect Mult Living                  Review of Systems  Constitutional: Negative for fever and chills.  Skin:       Abscess   All other systems reviewed and are negative.    Allergies  Penicillins  Home Medications   Current Outpatient Rx  Name  Route  Sig  Dispense  Refill  . ALPRAZOLAM 1 MG PO TABS   Oral  Take 1 mg by mouth 3 (three) times daily as needed. Anxiety.         Marland Kitchen FLUTICASONE-SALMETEROL 500-50 MCG/DOSE IN AEPB   Inhalation   Inhale 1 puff into the lungs every 12 (twelve) hours.         Marland Kitchen HYDROCHLOROTHIAZIDE 25 MG PO TABS   Oral   Take 25 mg by mouth daily.         Marland Kitchen NAPROXEN SODIUM 220 MG PO TABS   Oral   Take 220 mg by mouth 2 (two) times daily as needed. Headache.         Marland Kitchen SIMVASTATIN 40 MG PO TABS   Oral   Take 40 mg by mouth every evening.         Marland Kitchen TEMAZEPAM 15 MG PO CAPS   Oral   Take 15 mg by mouth at bedtime as needed. Sleep.         . VENLAFAXINE HCL ER 150 MG PO CP24   Oral   Take 150 mg by mouth daily.         Marland Kitchen DOXYCYCLINE HYCLATE 100 MG PO CAPS   Oral   Take 1 capsule (100 mg total) by mouth 2 (two) times daily.  14 capsule   0   . MELOXICAM 7.5 MG PO TABS      1 po daily with food   12 tablet   0   . OXYCODONE-ACETAMINOPHEN 5-325 MG PO TABS   Oral   Take 1 tablet by mouth every 6 (six) hours as needed for pain.   20 tablet   0     BP 115/83  Pulse 87  Temp 97.8 F (36.6 C) (Oral)  Resp 18  SpO2 100%  Physical Exam  Nursing note and vitals reviewed. Constitutional: She is oriented to person, place, and time. She appears well-developed and well-nourished. No distress.  HENT:  Head: Normocephalic and atraumatic.  Eyes: EOM are normal.  Neck: Normal range of motion.  Cardiovascular: Normal rate and regular rhythm.   Pulmonary/Chest: Effort normal.  Abdominal: Soft. She exhibits no distension. There is no tenderness.  Genitourinary:     Musculoskeletal: Normal range of motion.  Neurological: She is alert and oriented to person, place, and time.  Skin: Skin is warm and dry.  Psychiatric: She has a normal mood and affect. Judgment normal.    ED Course  Procedures (including critical care time)  Labs Reviewed - No data to display [place] Extrem Low Left Ltd  07/21/2012  *RADIOLOGY REPORT*  Clinical Data: Left  groin abscess.  LEFT LOWER EXTREMITY SOFT TISSUE ULTRASOUND LIMITED  Technique: Limited ultrasound of the left groin was performed.  Comparison:  None.  Findings: In the region of concern, there is a cutaneous or immediately subcutaneous to 0.8 x 0.4 x 1.5 cm anechoic region compatible with fluid collection just below the skin.  This could represent a small abscess.  Its superficial margin is 1 mm below the skin.  IMPRESSION:  1.  Immediately subcutaneous fluid collection along the groin measuring 0.8 x 0.4 x 1.5 cm could represent a small abscess.   Original Report Authenticated By: Gaylyn Rong, M.D.      1. Abscess, groin       MDM          Allison Field, PA 07/22/12 1542

## 2012-07-22 NOTE — ED Provider Notes (Signed)
Medical screening examination/treatment/procedure(s) were performed by non-physician practitioner and as supervising physician I was immediately available for consultation/collaboration.   Shelda Jakes, MD 07/22/12 [phone]

## 2012-08-27 ENCOUNTER — Emergency Department (HOSPITAL_COMMUNITY): Payer: 59

## 2012-08-27 ENCOUNTER — Emergency Department (HOSPITAL_COMMUNITY)
Admission: EM | Admit: 2012-08-27 | Discharge: 2012-08-27 | Disposition: A | Discharge status: Home / Self Care | Payer: 59 | Attending: Emergency Medicine | Admitting: Emergency Medicine

## 2012-08-27 ENCOUNTER — Encounter (HOSPITAL_COMMUNITY): Payer: Self-pay | Admitting: Emergency Medicine

## 2012-08-27 DIAGNOSIS — Z87891 Personal history of nicotine dependence: Secondary | ICD-10-CM | POA: Insufficient documentation

## 2012-08-27 DIAGNOSIS — Z8701 Personal history of pneumonia (recurrent): Secondary | ICD-10-CM

## 2012-08-27 DIAGNOSIS — I10 Essential (primary) hypertension: Secondary | ICD-10-CM | POA: Insufficient documentation

## 2012-08-27 DIAGNOSIS — J4489 Other specified chronic obstructive pulmonary disease: Secondary | ICD-10-CM | POA: Insufficient documentation

## 2012-08-27 DIAGNOSIS — E785 Hyperlipidemia, unspecified: Secondary | ICD-10-CM | POA: Insufficient documentation

## 2012-08-27 DIAGNOSIS — J449 Chronic obstructive pulmonary disease, unspecified: Secondary | ICD-10-CM | POA: Insufficient documentation

## 2012-08-27 DIAGNOSIS — Z79899 Other long term (current) drug therapy: Secondary | ICD-10-CM | POA: Insufficient documentation

## 2012-08-27 DIAGNOSIS — Z859 Personal history of malignant neoplasm, unspecified: Secondary | ICD-10-CM | POA: Insufficient documentation

## 2012-08-27 DIAGNOSIS — Z9981 Dependence on supplemental oxygen: Secondary | ICD-10-CM | POA: Insufficient documentation

## 2012-08-27 LAB — CBC WITH DIFFERENTIAL/PLATELET
Basophils Relative: 0 % (ref 0–1)
Lymphocytes Relative: 9 % — ABNORMAL LOW (ref 12–46)
MCHC: 32.4 g/dL (ref 30.0–36.0)
MCV: 91.4 fL (ref 78.0–100.0)
Neutro Abs: 12.2 10*3/uL — ABNORMAL HIGH (ref 1.7–7.7)
Platelets: 275 10*3/uL (ref 150–400)
RDW: 15 % (ref 11.5–15.5)

## 2012-08-27 LAB — BASIC METABOLIC PANEL
BUN: 18 mg/dL (ref 6–23)
CO2: 32 mEq/L (ref 19–32)
Calcium: 9 mg/dL (ref 8.4–10.5)
Chloride: 93 mEq/L — ABNORMAL LOW (ref 96–112)
Creatinine, Ser: 0.76 mg/dL (ref 0.50–1.10)
Glucose, Bld: 103 mg/dL — ABNORMAL HIGH (ref 70–99)

## 2012-08-27 MED ORDER — ALBUTEROL SULFATE HFA 108 (90 BASE) MCG/ACT IN AERS: 2.0000 | INHALATION_SPRAY | RESPIRATORY_TRACT | Status: DC | PRN

## 2012-08-27 MED ORDER — AEROCHAMBER PLUS FLO-VU LARGE MISC: 1.0000 | Freq: Once | Status: DC

## 2012-08-27 MED ORDER — ALBUTEROL SULFATE HFA 108 (90 BASE) MCG/ACT IN AERS
2.0000 | INHALATION_SPRAY | RESPIRATORY_TRACT | Status: AC
  Administered 2012-08-27: 2 via RESPIRATORY_TRACT
  Filled 2012-08-27: qty 6.7

## 2012-08-27 NOTE — ED Provider Notes (Signed)
History     CSN: [id number]  Arrival date & time 08/27/12  0609   First MD Initiated Contact with Patient 08/27/12 [phone]      Chief Complaint  Patient presents with  . Pneumonia    (Consider location/radiation/quality/duration/timing/severity/associated sxs/prior treatment) HPIJudy P Park is a 62 y.o. female who was diagnosed at the end of the regional a week ago with pneumonia. Patient was given antibiotics, bronchodilators and home oxygen. Patient actually refused admission at that time. Patient presents today because she says she needs a pulse oximetry test as well as an x-ray so that "insurance will cover my home O2 and nebulizer."  On room air, patient harbors around 90% oxygen saturation. She's mildly wheezing. She smells of cigarettes.  No new shortness of breath, she says this is chronic for her. She denies any chest pain. Her symptoms are moderate to severe.   Past Medical History  Diagnosis Date  . Hypertension   . Cancer   . Hyperlipemia   . COPD (chronic obstructive pulmonary disease)     Past Surgical History  Procedure Date  . Mastectomy   . Breast surgery   . Breast surgery   . Abdominal hysterectomy   . Back surgery     No family history on file.  History  Substance Use Topics  . Smoking status: Former Smoker -- 0.5 packs/day    Types: Cigarettes    Quit date: 08/19/2012  . Smokeless tobacco: Not on file  . Alcohol Use: No    OB History    Grav Para Term Preterm Abortions TAB SAB Ect Mult Living                  Review of Systems At least 10pt or greater review of systems completed and are negative except where specified in the HPI.  Allergies  Penicillins  Home Medications   Current Outpatient Rx  Name  Route  Sig  Dispense  Refill  . ALBUTEROL SULFATE (2.5 MG/3ML) 0.083% IN NEBU   Nebulization   Take 2.5 mg by nebulization every 6 (six) hours as needed. For shortness of breath         . ALPRAZOLAM 1 MG PO TABS   Oral   Take 1  mg by mouth 3 (three) times daily as needed. Anxiety.         Marland Kitchen FLUTICASONE-SALMETEROL 100-50 MCG/DOSE IN AEPB   Inhalation   Inhale 1 puff into the lungs every 12 (twelve) hours.         . GUAIFENESIN ER 600 MG PO TB12   Oral   Take 600 mg by mouth 2 (two) times daily.         . GUAIFENESIN 100 MG/5ML PO LIQD   Oral   Take 200 mg by mouth 3 (three) times daily as needed. For cough and congestion         . ADULT MULTIVITAMIN W/MINERALS CH   Oral   Take 1 tablet by mouth daily.         Marland Kitchen NAPROXEN SODIUM 220 MG PO TABS   Oral   Take 220 mg by mouth 2 (two) times daily as needed. Headache.         Marland Kitchen PREGABALIN 200 MG PO CAPS   Oral   Take 200 mg by mouth 2 (two) times daily.         Marland Kitchen SIMVASTATIN 40 MG PO TABS   Oral   Take 40 mg by mouth at bedtime.          Marland Kitchen  TEMAZEPAM 15 MG PO CAPS   Oral   Take 15 mg by mouth at bedtime as needed. For restless legs and sleep.         . TRIAMTERENE-HCTZ 37.5-25 MG PO TABS   Oral   Take 1 tablet by mouth daily.         . VENLAFAXINE HCL ER 150 MG PO CP24   Oral   Take 150 mg by mouth daily.           BP 122/67  Pulse 108  Temp 98.4 F (36.9 C) (Oral)  Resp 20  Ht 5\' 4"  (1.626 m)  Wt 150 lb (68.04 kg)  BMI 25.75 kg/m2  SpO2 94%  Physical Exam  Nursing notes reviewed.  Electronic medical record reviewed. VITAL SIGNS:   Filed Vitals:   08/27/12 0757 08/27/12 0800 08/27/12 0851 08/27/12 0901  BP:      Pulse:  108    Temp:      TempSrc:      Resp:  20    Height:      Weight:      SpO2: 87% 94% 91% 95%   CONSTITUTIONAL: Awake, oriented, appears non-toxic HENT: Atraumatic, normocephalic, oral mucosa pink and moist, airway patent. Nares patent without drainage. External ears normal. EYES: Conjunctiva clear, EOMI, PERRLA NECK: Trachea midline, non-tender, supple CARDIOVASCULAR: Normal heart rate, Normal rhythm, No murmurs, rubs, gallops PULMONARY/CHEST: Scattered minor wheezing throughout, good air  movement no rhonchi, or rales. Symmetrical breath sounds. Non-tender. ABDOMINAL: Non-distended, soft, non-tender - no rebound or guarding.  BS normal. NEUROLOGIC: Non-focal, moving all four extremities, no gross sensory or motor deficits. EXTREMITIES: No clubbing, cyanosis, or edema SKIN: Warm, Dry, No erythema, No rash  ED Course  Procedures (including critical care time)  Labs Reviewed  CBC WITH DIFFERENTIAL - Abnormal; Notable for the following:    WBC 14.5 (*)     Neutrophils Relative 84 (*)     Neutro Abs 12.2 (*)     Lymphocytes Relative 9 (*)     All other components within normal limits  BASIC METABOLIC PANEL - Abnormal; Notable for the following:    Sodium 132 (*)     Chloride 93 (*)     Glucose, Bld 103 (*)     GFR calc non Af Amer 90 (*)     All other components within normal limits   Dg Chest 2 View  08/27/2012  *RADIOLOGY REPORT*  Clinical Data: In  CHEST - 2 VIEW  Comparison: 06/17/2012.  Findings: Bilateral calcified breast implants are present.  There is no airspace disease.  Bilateral pleural apical thickening is present.  Hyperinflation compatible with emphysema.  The cardiopericardial silhouette appears within normal limits. Monitoring leads are projected over the chest.  Lower cervical ACDF.  Linear subsegmental atelectasis or scarring at the left lung base.  IMPRESSION: No evidence of pneumonia.  Emphysema.   Original Report Authenticated By: Andreas Newport, M.D.      1. COPD (chronic obstructive pulmonary disease)   2. H/O: pneumonia   3. Oxygen dependent       MDM  Allison Park is a 62 y.o. female with COPD who is newly oxygen dependent. Our x-ray shows cleared pneumonia. No evidence for new infection on clinical exam or radiology exam. When patient is ambulated, she desaturates on room air into the mid 80s. Have written the patient a prescription for oxygen, and given her an albuterol inhaler and spacer for her COPD.  We'll send the  patient home with an  oxygen tank.  She'll followup with her primary care physician for further care. I don't think that her shortness of breath represents anything besides her chronic COPD-she says it's been the same, and the only reason she is here is for pulse oximetry test. Do not think she's got a PE and do not think her shortness of breath represents acute coronary syndrome.  I explained the diagnosis and have given explicit precautions to return to the ER including chest pain, worsening shortness of breath, hemoptysis or any other new or worsening symptoms. The patient understands and accepts the medical plan as it's been dictated and I have answered their questions. Discharge instructions concerning home care and prescriptions have been given.  The patient is STABLE and is discharged to home in good condition.          Jones Skene, MD 08/28/12 [phone]

## 2012-08-27 NOTE — ED Notes (Signed)
Respiratory therapy paged for inhaler instruction.

## 2012-08-27 NOTE — ED Notes (Signed)
Patient was seen at [facility] a week ago and diagnosed with pneumonia in both lungs.  Patient refused admission at that time.  Patient states she saw her primary MD on Thursday and he wants an xray and she needs a pulse ox test for [facility] to keep her home O2 and nebulizer.

## 2012-09-01 ENCOUNTER — Encounter (HOSPITAL_COMMUNITY): Payer: Self-pay | Admitting: Emergency Medicine

## 2012-09-01 ENCOUNTER — Emergency Department (HOSPITAL_COMMUNITY)
Admission: EM | Admit: 2012-09-01 | Discharge: 2012-09-01 | Disposition: A | Discharge status: Home / Self Care | Payer: 59 | Attending: Emergency Medicine | Admitting: Emergency Medicine

## 2012-09-01 ENCOUNTER — Emergency Department (HOSPITAL_COMMUNITY): Payer: 59

## 2012-09-01 DIAGNOSIS — M549 Dorsalgia, unspecified: Secondary | ICD-10-CM | POA: Insufficient documentation

## 2012-09-01 DIAGNOSIS — J438 Other emphysema: Secondary | ICD-10-CM | POA: Insufficient documentation

## 2012-09-01 DIAGNOSIS — I10 Essential (primary) hypertension: Secondary | ICD-10-CM | POA: Insufficient documentation

## 2012-09-01 DIAGNOSIS — E785 Hyperlipidemia, unspecified: Secondary | ICD-10-CM | POA: Insufficient documentation

## 2012-09-01 DIAGNOSIS — IMO0002 Reserved for concepts with insufficient information to code with codable children: Secondary | ICD-10-CM | POA: Insufficient documentation

## 2012-09-01 DIAGNOSIS — R059 Cough, unspecified: Secondary | ICD-10-CM | POA: Insufficient documentation

## 2012-09-01 DIAGNOSIS — Z9981 Dependence on supplemental oxygen: Secondary | ICD-10-CM | POA: Insufficient documentation

## 2012-09-01 DIAGNOSIS — J449 Chronic obstructive pulmonary disease, unspecified: Secondary | ICD-10-CM | POA: Insufficient documentation

## 2012-09-01 DIAGNOSIS — Z87891 Personal history of nicotine dependence: Secondary | ICD-10-CM | POA: Insufficient documentation

## 2012-09-01 DIAGNOSIS — J4489 Other specified chronic obstructive pulmonary disease: Secondary | ICD-10-CM | POA: Insufficient documentation

## 2012-09-01 DIAGNOSIS — Z79899 Other long term (current) drug therapy: Secondary | ICD-10-CM | POA: Insufficient documentation

## 2012-09-01 DIAGNOSIS — J439 Emphysema, unspecified: Secondary | ICD-10-CM

## 2012-09-01 DIAGNOSIS — R05 Cough: Secondary | ICD-10-CM

## 2012-09-01 DIAGNOSIS — Z853 Personal history of malignant neoplasm of breast: Secondary | ICD-10-CM | POA: Insufficient documentation

## 2012-09-01 LAB — CBC WITH DIFFERENTIAL/PLATELET
Basophils Absolute: 0.1 10*3/uL (ref 0.0–0.1)
Basophils Relative: 1 % (ref 0–1)
Eosinophils Relative: 5 % (ref 0–5)
HCT: 40.4 % (ref 36.0–46.0)
Hemoglobin: 13.2 g/dL (ref 12.0–15.0)
MCHC: 32.7 g/dL (ref 30.0–36.0)
MCV: 90.2 fL (ref 78.0–100.0)
Monocytes Absolute: 0.6 10*3/uL (ref 0.1–1.0)
Monocytes Relative: 7 % (ref 3–12)
RDW: 15.2 % (ref 11.5–15.5)

## 2012-09-01 LAB — POCT I-STAT, CHEM 8
BUN: 19 mg/dL (ref 6–23)
Creatinine, Ser: 1.2 mg/dL — ABNORMAL HIGH (ref 0.50–1.10)
Glucose, Bld: 110 mg/dL — ABNORMAL HIGH (ref 70–99)
Hemoglobin: 13.9 g/dL (ref 12.0–15.0)
Potassium: 3.8 mEq/L (ref 3.5–5.1)
Sodium: 140 mEq/L (ref 135–145)
TCO2: 30 mmol/L (ref 0–100)

## 2012-09-01 MED ORDER — ALPRAZOLAM 1 MG PO TABS: 1.0000 mg | ORAL_TABLET | Freq: Every evening | ORAL | Status: DC | PRN

## 2012-09-01 MED ORDER — PREDNISONE 20 MG PO TABS: 40.0000 mg | ORAL_TABLET | Freq: Every day | ORAL | Status: DC

## 2012-09-01 MED ORDER — HYDROCOD POLST-CHLORPHEN POLST 10-8 MG/5ML PO LQCR: 5.0000 mL | Freq: Two times a day (BID) | ORAL | Status: DC | PRN

## 2012-09-01 MED ORDER — IOHEXOL 350 MG/ML SOLN: 150.0000 mL | Freq: Once | INTRAVENOUS | Status: DC | PRN

## 2012-09-01 MED ORDER — HYDROCOD POLST-CHLORPHEN POLST 10-8 MG/5ML PO LQCR
5.0000 mL | Freq: Once | ORAL | Status: AC
  Administered 2012-09-01: 5 mL via ORAL
  Filled 2012-09-01: qty 5

## 2012-09-01 MED ORDER — SODIUM CHLORIDE 0.9 % IV BOLUS (SEPSIS)
500.0000 mL | Freq: Once | INTRAVENOUS | Status: AC
  Administered 2012-09-01: 500 mL via INTRAVENOUS

## 2012-09-01 MED ORDER — OXYCODONE-ACETAMINOPHEN 5-325 MG PO TABS
2.0000 | ORAL_TABLET | Freq: Once | ORAL | Status: AC
  Administered 2012-09-01: 2 via ORAL
  Filled 2012-09-01: qty 2

## 2012-09-01 MED ORDER — IOHEXOL 350 MG/ML SOLN
100.0000 mL | Freq: Once | INTRAVENOUS | Status: AC | PRN
  Administered 2012-09-01: 100 mL via INTRAVENOUS

## 2012-09-01 NOTE — ED Notes (Signed)
Patient transported to CT 

## 2012-09-01 NOTE — ED Notes (Signed)
Patient presents to ER via [facility] with c/o shortness of breath.  Patient states was seen here 2 days ago for same.  Patient is on home O2 at all times.

## 2012-09-01 NOTE — ED Notes (Signed)
Pt was given oxygen tank to go home with and states she will bring it back.

## 2012-09-01 NOTE — ED Provider Notes (Addendum)
History     CSN: [id number]  Arrival date & time 09/01/12  4098   First MD Initiated Contact with Patient 09/01/12 [phone]      Chief Complaint  Patient presents with  . Shortness of Breath    (Consider location/radiation/quality/duration/timing/severity/associated sxs/prior treatment) HPI Comments: 62 y/o female with hx of COPD who has recently become O2 dependant who presents with c/o back pain and SOB - she was seen in the ED several days ago and found to have ongoing O2 requirement due to her chronic COPD and tobacco abuse - she had recently had pna but had cleared as of last CXR several days ago.  She is also a breast cancer survivor who  Has been in remission for greater than 20 years. She states that she awoke from sleep this evening at 2:00 AM with sharp chest pain which is located in her upper back, is persistent, worse with deep breathing. She describes this "my lungs hurt".  Yesterday was a good day for her, her symptoms have been well controlled and she was doing fairly well. She denies swelling in the legs, abdominal pain, fevers but has had a persistent cough. This has improved after taking Tussionex which she has subsequently run out of. She denies fevers, chills, travel, sick contacts.  Patient is a 62 y.o. female presenting with shortness of breath. The history is provided by the patient, medical records and the EMS personnel.  Shortness of Breath  Associated symptoms include shortness of breath.    Past Medical History  Diagnosis Date  . Hypertension   . Cancer   . Hyperlipemia   . COPD (chronic obstructive pulmonary disease)     Past Surgical History  Procedure Date  . Mastectomy   . Breast surgery   . Breast surgery   . Abdominal hysterectomy   . Back surgery     No family history on file.  History  Substance Use Topics  . Smoking status: Former Smoker -- 0.5 packs/day    Types: Cigarettes    Quit date: 08/19/2012  . Smokeless tobacco: Not on file  .  Alcohol Use: No    OB History    Grav Para Term Preterm Abortions TAB SAB Ect Mult Living                  Review of Systems  Respiratory: Positive for shortness of breath.   All other systems reviewed and are negative.    Allergies  Penicillins  Home Medications   Current Outpatient Rx  Name  Route  Sig  Dispense  Refill  . ALBUTEROL SULFATE (2.5 MG/3ML) 0.083% IN NEBU   Nebulization   Take 2.5 mg by nebulization every 6 (six) hours as needed. For shortness of breath         . ALPRAZOLAM 1 MG PO TABS   Oral   Take 1 mg by mouth 3 (three) times daily as needed. Anxiety.         Marland Kitchen FLUTICASONE-SALMETEROL 100-50 MCG/DOSE IN AEPB   Inhalation   Inhale 1 puff into the lungs every 12 (twelve) hours.         . GUAIFENESIN ER 600 MG PO TB12   Oral   Take 600 mg by mouth 2 (two) times daily.         . GUAIFENESIN 100 MG/5ML PO LIQD   Oral   Take 200 mg by mouth 3 (three) times daily as needed. For cough and congestion         .  ADULT MULTIVITAMIN W/MINERALS CH   Oral   Take 1 tablet by mouth daily.         Marland Kitchen NAPROXEN SODIUM 220 MG PO TABS   Oral   Take 220 mg by mouth 2 (two) times daily as needed. Headache.         Marland Kitchen PREGABALIN 200 MG PO CAPS   Oral   Take 200 mg by mouth 2 (two) times daily.         Marland Kitchen SIMVASTATIN 40 MG PO TABS   Oral   Take 40 mg by mouth at bedtime.          Marland Kitchen TEMAZEPAM 15 MG PO CAPS   Oral   Take 15 mg by mouth at bedtime as needed. For restless legs and sleep.         . TRIAMTERENE-HCTZ 37.5-25 MG PO TABS   Oral   Take 1 tablet by mouth daily.         . VENLAFAXINE HCL ER 150 MG PO CP24   Oral   Take 150 mg by mouth daily.         Marland Kitchen ALPRAZOLAM 1 MG PO TABS   Oral   Take 1 tablet (1 mg total) by mouth at bedtime as needed for sleep.   6 tablet   0   . HYDROCOD POLST-CPM POLST ER 10-8 MG/5ML PO LQCR   Oral   Take 5 mLs by mouth every 12 (twelve) hours as needed (Cough).   115 mL   0   . PREDNISONE 20  MG PO TABS   Oral   Take 2 tablets (40 mg total) by mouth daily.   10 tablet   0     BP 145/81  Pulse 104  Temp 98.4 F (36.9 C) (Oral)  Ht 5\' 4"  (1.626 m)  Wt 150 lb (68.04 kg)  BMI 25.75 kg/m2  SpO2 92%  Physical Exam  Nursing note and vitals reviewed. Constitutional: She appears well-developed and well-nourished. No distress.  HENT:  Head: Normocephalic and atraumatic.  Mouth/Throat: Oropharynx is clear and moist. No oropharyngeal exudate.  Eyes: Conjunctivae normal and EOM are normal. Pupils are equal, round, and reactive to light. Right eye exhibits no discharge. Left eye exhibits no discharge. No scleral icterus.  Neck: Normal range of motion. Neck supple. No JVD present. No thyromegaly present.  Cardiovascular: Regular rhythm, normal heart sounds and intact distal pulses.  Exam reveals no gallop and no friction rub.   No murmur heard.      Mild tachycardia  Pulmonary/Chest: Effort normal and breath sounds normal. No respiratory distress. She has no wheezes. She has no rales.       There is no wheezing rales or rhonchi.  Abdominal: Soft. Bowel sounds are normal. She exhibits no distension and no mass. There is no tenderness.  Musculoskeletal: Normal range of motion. She exhibits no edema and no tenderness.  Lymphadenopathy:    She has no cervical adenopathy.  Neurological: She is alert. Coordination normal.  Skin: Skin is warm and dry. No rash noted. No erythema.  Psychiatric: She has a normal mood and affect. Her behavior is normal.    ED Course  Procedures (including critical care time)  Labs Reviewed  D-DIMER, QUANTITATIVE - Abnormal; Notable for the following:    D-Dimer, Quant 0.57 (*)     All other components within normal limits  POCT I-STAT, CHEM 8 - Abnormal; Notable for the following:    Creatinine, Ser 1.20 (*)  Glucose, Bld 110 (*)     All other components within normal limits  CBC WITH DIFFERENTIAL  POCT I-STAT TROPONIN I   Dg Chest 2  View  09/01/2012  *RADIOLOGY REPORT*  Clinical Data: Chest pain.  Shortness of breath.  CHEST - 2 VIEW  Comparison: 08/27/2012  Findings: Normal heart size and pulmonary vascularity.  Diffuse emphysematous changes and scattered fibrosis in the lungs.  Soft tissue attenuation in the chest.  No focal airspace consolidation. No blunting of costophrenic angles.  No pneumothorax. Postoperative changes in the cervical spine.  Stable appearance since previous study.  IMPRESSION: Diffuse emphysematous change.  No evidence of active pulmonary disease.   Original Report Authenticated By: Burman Nieves, M.D.    Ct Angio Chest Pe W/cm &/or Wo Cm  09/01/2012  *RADIOLOGY REPORT*  Clinical Data: Sharp upper back pain.  Positive D-dimer.  History of breast cancer and mastectomy.  CT ANGIOGRAPHY CHEST  Technique:  Multidetector CT imaging of the chest using the standard protocol during bolus administration of intravenous contrast. Multiplanar reconstructed images including MIPs were obtained and reviewed to evaluate the vascular anatomy.  Contrast: OMNIPAQUE IOHEXOL 350 MG/ML SOLN  Comparison: None.  Findings: Technically adequate study with good opacification of the central and segmental pulmonary arteries.  No focal filling defects are demonstrated.  No evidence of significant pulmonary embolus.  Normal heart size.  Normal caliber thoracic aorta.  No significant lymphadenopathy in the chest.  The esophagus is mostly decompressed.  Visualized portions of the upper abdominal organs are grossly unremarkable.  Bilateral breast implants.  Diffuse emphysematous changes and interstitial fibrosis in the lungs.  Peribronchial thickening suggesting chronic bronchitis. The airways appear patent.  There are linear areas of scarring or atelectasis in the anterior lungs bilaterally.  No focal consolidation.  Scattered calcified granulomas in the lungs.  No pneumothorax.  No pleural effusions.  Degenerative changes in the thoracic  spine.  No vertebral compression or destructive bone lesion.  Postoperative change in the cervical spine.  IMPRESSION: No evidence of pulmonary embolus.  Diffuse emphysema and scattered fibrosis in the lungs.   Original Report Authenticated By: Burman Nieves, M.D.      1. Back pain   2. Cough   3. Emphysema       MDM  With supplemental oxygen the oxygen level is 92%, she is not in any respiratory distress but does have this persistent upper back pain. She does have some risk for pulmonary embolism with her history of breast cancer, hypoxia and relative tachycardia. While this could just represent her COPD as this is her third visit in the last short amount of time it would be reasonable to rule her out for pulmonary embolism as well. Chest x-ray pending, labs, d-dimer initially. In the meantime patient will be treated with Tussionex containing hydrocodone   Pt has improved with medicines - has ongoing mild back pain with coughing but overall has no tachycardia on my reevaluation and no hypotension - she has mild hypoxia at 96% on her supplemental O2 and has no signs of PE or PNA on CT scan.  Labs othewise normal including troponin, CBC and chemistry which was unremarkable.    Will d/c home in stable condition - pt is agreeable to same and has expressed her understanding to the indication for return.  ED ECG REPORT  I personally interpreted this EKG   Date: 09/01/2012   Rate: 98  Rhythm: normal sinus rhythm  QRS Axis: normal  Intervals: normal  ST/T Wave abnormalities: normal  Conduction Disutrbances:none  Narrative Interpretation:   Old EKG Reviewed: Compared with January 2011, rate has decreased, no other significant changes   Discharge Prescriptions include:  Tussionex Xanax (patient needs two day refill until can be seen by PMD)   Vida Roller, MD 09/01/12 1610  Vida Roller, MD 09/01/12 [phone]

## 2012-09-02 ENCOUNTER — Encounter (HOSPITAL_COMMUNITY): Payer: Self-pay | Admitting: *Deleted

## 2012-09-02 ENCOUNTER — Emergency Department (HOSPITAL_COMMUNITY): Payer: 59

## 2012-09-02 ENCOUNTER — Observation Stay (HOSPITAL_COMMUNITY)
Admission: EM | Admit: 2012-09-02 | Discharge: 2012-09-04 | Disposition: A | Discharge status: Home / Self Care | Payer: 59 | Attending: Internal Medicine | Admitting: Internal Medicine

## 2012-09-02 DIAGNOSIS — E785 Hyperlipidemia, unspecified: Secondary | ICD-10-CM

## 2012-09-02 DIAGNOSIS — I10 Essential (primary) hypertension: Secondary | ICD-10-CM

## 2012-09-02 DIAGNOSIS — R55 Syncope and collapse: Principal | ICD-10-CM

## 2012-09-02 DIAGNOSIS — T50905A Adverse effect of unspecified drugs, medicaments and biological substances, initial encounter: Secondary | ICD-10-CM

## 2012-09-02 DIAGNOSIS — F17200 Nicotine dependence, unspecified, uncomplicated: Secondary | ICD-10-CM | POA: Insufficient documentation

## 2012-09-02 DIAGNOSIS — J4489 Other specified chronic obstructive pulmonary disease: Secondary | ICD-10-CM | POA: Insufficient documentation

## 2012-09-02 DIAGNOSIS — J8410 Pulmonary fibrosis, unspecified: Secondary | ICD-10-CM

## 2012-09-02 DIAGNOSIS — J449 Chronic obstructive pulmonary disease, unspecified: Secondary | ICD-10-CM

## 2012-09-02 DIAGNOSIS — K219 Gastro-esophageal reflux disease without esophagitis: Secondary | ICD-10-CM

## 2012-09-02 DIAGNOSIS — F319 Bipolar disorder, unspecified: Secondary | ICD-10-CM

## 2012-09-02 DIAGNOSIS — Z720 Tobacco use: Secondary | ICD-10-CM

## 2012-09-02 DIAGNOSIS — R0602 Shortness of breath: Secondary | ICD-10-CM | POA: Insufficient documentation

## 2012-09-02 DIAGNOSIS — R079 Chest pain, unspecified: Secondary | ICD-10-CM | POA: Insufficient documentation

## 2012-09-02 LAB — CBC WITH DIFFERENTIAL/PLATELET
Basophils Relative: 0 % (ref 0–1)
Eosinophils Absolute: 0 10*3/uL (ref 0.0–0.7)
Eosinophils Relative: 0 % (ref 0–5)
Hemoglobin: 13 g/dL (ref 12.0–15.0)
Lymphs Abs: 2.2 10*3/uL (ref 0.7–4.0)
MCH: 29.3 pg (ref 26.0–34.0)
MCHC: 32.2 g/dL (ref 30.0–36.0)
MCV: 91.2 fL (ref 78.0–100.0)
Monocytes Relative: 7 % (ref 3–12)
Neutrophils Relative %: 80 % — ABNORMAL HIGH (ref 43–77)
RBC: 4.43 MIL/uL (ref 3.87–5.11)

## 2012-09-02 LAB — COMPREHENSIVE METABOLIC PANEL
Albumin: 3.8 g/dL (ref 3.5–5.2)
BUN: 15 mg/dL (ref 6–23)
Calcium: 9.9 mg/dL (ref 8.4–10.5)
Creatinine, Ser: 0.77 mg/dL (ref 0.50–1.10)
GFR calc Af Amer: >90 mL/min (ref >90)
Glucose, Bld: 115 mg/dL — ABNORMAL HIGH (ref 70–99)
Total Protein: 7.7 g/dL (ref 6.0–8.3)

## 2012-09-02 LAB — BLOOD GAS, ARTERIAL
O2 Content: 1.5 L/min
pCO2 arterial: 39.9 mmHg (ref 35.0–45.0)
pH, Arterial: 7.453 — ABNORMAL HIGH (ref 7.350–7.450)
pO2, Arterial: 70.4 mmHg — ABNORMAL LOW (ref 80.0–100.0)

## 2012-09-02 LAB — TROPONIN I: Troponin I: <0.3 ng/mL (ref <0.30)

## 2012-09-02 MED ORDER — GI COCKTAIL ~~LOC~~
30.0000 mL | Freq: Once | ORAL | Status: AC
  Administered 2012-09-02: 30 mL via ORAL
  Filled 2012-09-02: qty 30

## 2012-09-02 NOTE — ED Notes (Signed)
Dr. Estell Harpin notified of patient's complaint of epigastric chest pain and frequent burping. Stated to give GI cocktail.

## 2012-09-02 NOTE — ED Notes (Signed)
Patient's family member called nursing staff into room, stating patient was complaining of sudden onset chest pain and burping. Patient reports sharp pain to mid chest/epigastric area.

## 2012-09-02 NOTE — ED Provider Notes (Signed)
History  This chart was scribed for Benny Lennert, MD by Erskine Emery, ED Scribe. This patient was seen in room APA18/APA18 and the patient's care was started at 21:34.   CSN: [id number]  Arrival date & time 09/02/12  2052   First MD Initiated Contact with Patient 09/02/12 2134      Chief Complaint  Patient presents with  . Loss of Consciousness    (Consider location/radiation/quality/duration/timing/severity/associated sxs/prior Treatment) Allison Park is a 62 y.o. female who presents to the Emergency Department complaining of about 4 episodes of syncope while sitting or laying, just after exertion. Pt reports some associated coughing up black sputum, SOB, chest pain, and low back pain. Pt's family report she has been talking gibberish and slurring her speech recently. She was recently put on prednisone and Symbyax that have caused changes in her behavior. Pt's son also thinks she may be accidentally taking far too much of her medication; he noticed 15 of her Sybbyax pills missing and states "she has a habit of doing that." Patient is a 62 y.o. female presenting with syncope. The history is provided by the patient and a relative. No language interpreter was used.  Loss of Consciousness This is a new problem. The current episode started yesterday. The problem occurs hourly. The problem has not changed since onset.Associated symptoms include chest pain and shortness of breath. Pertinent negatives include no abdominal pain and no headaches. The symptoms are aggravated by exertion. Nothing relieves the symptoms. Treatments tried: prednisone. The treatment provided no relief.  Pt has COPD, for which she is now O2 dependent. She was recently diagnosed with pneumonia when seen in [place] for the same complaint. They wanted to admit her but she left AMA. She was taking antibiotics but is off them now, as the pneumonia has cleared. Pt still smokes. Pt also has a h/o breast cancer but she has been in  remission for 20 years.  Dr. Bascom Levels is the pt's PCP, who she has not seen since December 7th.   Past Medical History  Diagnosis Date  . Hypertension   . Hyperlipemia   . COPD (chronic obstructive pulmonary disease)   . Cancer     Past Surgical History  Procedure Date  . Mastectomy   . Breast surgery   . Breast surgery   . Abdominal hysterectomy   . Back surgery     History reviewed. No pertinent family history.  History  Substance Use Topics  . Smoking status: Current Every Day Smoker -- 0.5 packs/day    Types: Cigarettes    Last Attempt to Quit: 08/19/2012  . Smokeless tobacco: Not on file  . Alcohol Use: No    OB History    Grav Para Term Preterm Abortions TAB SAB Ect Mult Living                  Review of Systems  Constitutional: Negative for fatigue.  HENT: Negative for congestion, sinus pressure and ear discharge.   Eyes: Negative for discharge.  Respiratory: Positive for cough and shortness of breath.   Cardiovascular: Positive for chest pain and syncope.  Gastrointestinal: Negative for abdominal pain and diarrhea.  Genitourinary: Negative for frequency and hematuria.  Musculoskeletal: Positive for back pain.  Skin: Negative for rash.  Neurological: Positive for syncope. Negative for seizures and headaches.  Hematological: Negative.   Psychiatric/Behavioral: Negative for hallucinations.  All other systems reviewed and are negative.    Allergies  Penicillins  Home Medications   Current  Outpatient Rx  Name  Route  Sig  Dispense  Refill  . ALBUTEROL SULFATE (2.5 MG/3ML) 0.083% IN NEBU   Nebulization   Take 2.5 mg by nebulization every 6 (six) hours as needed. For shortness of breath         . ALPRAZOLAM 1 MG PO TABS   Oral   Take 1 mg by mouth 3 (three) times daily as needed. Anxiety.         . ALPRAZOLAM 1 MG PO TABS   Oral   Take 1 tablet (1 mg total) by mouth at bedtime as needed for sleep.   6 tablet   0   . HYDROCOD POLST-CPM  POLST ER 10-8 MG/5ML PO LQCR   Oral   Take 5 mLs by mouth every 12 (twelve) hours as needed (Cough).   115 mL   0   . FLUTICASONE-SALMETEROL 100-50 MCG/DOSE IN AEPB   Inhalation   Inhale 1 puff into the lungs every 12 (twelve) hours.         . GUAIFENESIN ER 600 MG PO TB12   Oral   Take 600 mg by mouth 2 (two) times daily.         . GUAIFENESIN 100 MG/5ML PO LIQD   Oral   Take 200 mg by mouth 3 (three) times daily as needed. For cough and congestion         . ADULT MULTIVITAMIN W/MINERALS CH   Oral   Take 1 tablet by mouth daily.         Marland Kitchen NAPROXEN SODIUM 220 MG PO TABS   Oral   Take 220 mg by mouth 2 (two) times daily as needed. Headache.         Marland Kitchen PREDNISONE 20 MG PO TABS   Oral   Take 2 tablets (40 mg total) by mouth daily.   10 tablet   0   . PREGABALIN 200 MG PO CAPS   Oral   Take 200 mg by mouth 2 (two) times daily.         Marland Kitchen SIMVASTATIN 40 MG PO TABS   Oral   Take 40 mg by mouth at bedtime.          Marland Kitchen TEMAZEPAM 15 MG PO CAPS   Oral   Take 15 mg by mouth at bedtime as needed. For restless legs and sleep.         . TRIAMTERENE-HCTZ 37.5-25 MG PO TABS   Oral   Take 1 tablet by mouth daily.         . VENLAFAXINE HCL ER 150 MG PO CP24   Oral   Take 150 mg by mouth daily.           Triage Vitals: BP 160/95  Pulse 114  Temp 98.3 F (36.8 C) (Oral)  Resp 20  Ht 5\' 4"  (1.626 m)  Wt 150 lb (68.04 kg)  BMI 25.75 kg/m2  SpO2 95%  Physical Exam  Nursing note and vitals reviewed. Constitutional: She is oriented to person, place, and time. She appears well-developed.  HENT:  Head: Normocephalic and atraumatic.  Eyes: Conjunctivae normal and EOM are normal. No scleral icterus.  Neck: Neck supple. No thyromegaly present.  Cardiovascular: Normal rate and regular rhythm.  Exam reveals no gallop and no friction rub.   No murmur heard. Pulmonary/Chest: No stridor. She has wheezes. She has no rales. She exhibits no tenderness.       Mild  wheezing throughout.  Abdominal: She exhibits  no distension. There is no tenderness. There is no rebound.  Musculoskeletal: Normal range of motion. She exhibits no edema.  Lymphadenopathy:    She has no cervical adenopathy.  Neurological: She is oriented to person, place, and time. Coordination normal.  Skin: No rash noted. No erythema.  Psychiatric: She has a normal mood and affect. Her behavior is normal.    ED Course  Procedures (including critical care time) DIAGNOSTIC STUDIES: Oxygen Saturation is 95% on [place], adequate by my interpretation.    COORDINATION OF CARE: 21:44--I evaluated the patient and we discussed a treatment plan including chest x-ray, blood work, and EKG to which the pt and her family agreed.    Labs Reviewed - No data to display Dg Chest 2 View  09/01/2012  *RADIOLOGY REPORT*  Clinical Data: Chest pain.  Shortness of breath.  CHEST - 2 VIEW  Comparison: 08/27/2012  Findings: Normal heart size and pulmonary vascularity.  Diffuse emphysematous changes and scattered fibrosis in the lungs.  Soft tissue attenuation in the chest.  No focal airspace consolidation. No blunting of costophrenic angles.  No pneumothorax. Postoperative changes in the cervical spine.  Stable appearance since previous study.  IMPRESSION: Diffuse emphysematous change.  No evidence of active pulmonary disease.   Original Report Authenticated By: Burman Nieves, M.D.    Ct Angio Chest Pe W/cm &/or Wo Cm  09/01/2012  *RADIOLOGY REPORT*  Clinical Data: Sharp upper back pain.  Positive D-dimer.  History of breast cancer and mastectomy.  CT ANGIOGRAPHY CHEST  Technique:  Multidetector CT imaging of the chest using the standard protocol during bolus administration of intravenous contrast. Multiplanar reconstructed images including MIPs were obtained and reviewed to evaluate the vascular anatomy.  Contrast: OMNIPAQUE IOHEXOL 350 MG/ML SOLN  Comparison: None.  Findings: Technically adequate study with  good opacification of the central and segmental pulmonary arteries.  No focal filling defects are demonstrated.  No evidence of significant pulmonary embolus.  Normal heart size.  Normal caliber thoracic aorta.  No significant lymphadenopathy in the chest.  The esophagus is mostly decompressed.  Visualized portions of the upper abdominal organs are grossly unremarkable.  Bilateral breast implants.  Diffuse emphysematous changes and interstitial fibrosis in the lungs.  Peribronchial thickening suggesting chronic bronchitis. The airways appear patent.  There are linear areas of scarring or atelectasis in the anterior lungs bilaterally.  No focal consolidation.  Scattered calcified granulomas in the lungs.  No pneumothorax.  No pleural effusions.  Degenerative changes in the thoracic spine.  No vertebral compression or destructive bone lesion.  Postoperative change in the cervical spine.  IMPRESSION: No evidence of pulmonary embolus.  Diffuse emphysema and scattered fibrosis in the lungs.   Original Report Authenticated By: Burman Nieves, M.D.      No diagnosis found.    Date: 09/02/2012  [id number]  Rhythm: sinus tachycardia  QRS Axis: normal  Intervals: normal  ST/T Wave abnormalities: normal  Conduction Disutrbances:none  Narrative Interpretation:   Old EKG Reviewed: unchanged   MDM   The chart was scribed for me under my direct supervision.  I personally performed the history, physical, and medical decision making and all procedures in the evaluation of this patient.Benny Lennert, MD 09/02/12 2322

## 2012-09-02 NOTE — ED Notes (Signed)
Had pneumonia 1/3.  Has finished antibiotic  Seen here 1/16  For sob.  And given an inhaler,  Says she is worse, "blacking out". "black " sputum.  Chest pain and pain in low back.  "I want to be admitted".

## 2012-09-03 ENCOUNTER — Observation Stay (HOSPITAL_COMMUNITY): Payer: 59

## 2012-09-03 ENCOUNTER — Encounter (HOSPITAL_COMMUNITY): Payer: Self-pay | Admitting: General Practice

## 2012-09-03 DIAGNOSIS — I10 Essential (primary) hypertension: Secondary | ICD-10-CM

## 2012-09-03 DIAGNOSIS — E785 Hyperlipidemia, unspecified: Secondary | ICD-10-CM | POA: Diagnosis present

## 2012-09-03 DIAGNOSIS — F319 Bipolar disorder, unspecified: Secondary | ICD-10-CM

## 2012-09-03 DIAGNOSIS — T50905A Adverse effect of unspecified drugs, medicaments and biological substances, initial encounter: Secondary | ICD-10-CM | POA: Diagnosis present

## 2012-09-03 DIAGNOSIS — R55 Syncope and collapse: Principal | ICD-10-CM

## 2012-09-03 DIAGNOSIS — K219 Gastro-esophageal reflux disease without esophagitis: Secondary | ICD-10-CM | POA: Diagnosis present

## 2012-09-03 DIAGNOSIS — J8410 Pulmonary fibrosis, unspecified: Secondary | ICD-10-CM | POA: Diagnosis present

## 2012-09-03 DIAGNOSIS — J4489 Other specified chronic obstructive pulmonary disease: Secondary | ICD-10-CM

## 2012-09-03 DIAGNOSIS — J449 Chronic obstructive pulmonary disease, unspecified: Secondary | ICD-10-CM

## 2012-09-03 DIAGNOSIS — Z720 Tobacco use: Secondary | ICD-10-CM

## 2012-09-03 DIAGNOSIS — T887XXA Unspecified adverse effect of drug or medicament, initial encounter: Secondary | ICD-10-CM

## 2012-09-03 LAB — TROPONIN I
Troponin I: <0.3 ng/mL (ref <0.30)
Troponin I: <0.3 ng/mL (ref <0.30)

## 2012-09-03 LAB — PRO B NATRIURETIC PEPTIDE: Pro B Natriuretic peptide (BNP): 278.5 pg/mL — ABNORMAL HIGH (ref 0–125)

## 2012-09-03 MED ORDER — SODIUM CHLORIDE 0.9 % IJ SOLN
3.0000 mL | Freq: Two times a day (BID) | INTRAMUSCULAR | Status: DC
  Administered 2012-09-03 (×3): 3 mL via INTRAVENOUS

## 2012-09-03 MED ORDER — ACETAMINOPHEN 325 MG PO TABS: 650.0000 mg | ORAL_TABLET | Freq: Four times a day (QID) | ORAL | Status: DC | PRN

## 2012-09-03 MED ORDER — GI COCKTAIL ~~LOC~~: 30.0000 mL | Freq: Three times a day (TID) | ORAL | Status: DC | PRN

## 2012-09-03 MED ORDER — PREGABALIN 50 MG PO CAPS
50.0000 mg | ORAL_CAPSULE | Freq: Every day | ORAL | Status: DC
  Administered 2012-09-03 – 2012-09-04 (×2): 50 mg via ORAL
  Filled 2012-09-03 (×2): qty 1

## 2012-09-03 MED ORDER — BIOTENE DRY MOUTH MT LIQD
15.0000 mL | Freq: Two times a day (BID) | OROMUCOSAL | Status: DC
  Administered 2012-09-03 – 2012-09-04 (×3): 15 mL via OROMUCOSAL

## 2012-09-03 MED ORDER — POLYETHYLENE GLYCOL 3350 17 G PO PACK: 17.0000 g | PACK | Freq: Every day | ORAL | Status: DC | PRN

## 2012-09-03 MED ORDER — ONDANSETRON HCL 4 MG/2ML IJ SOLN: 4.0000 mg | Freq: Four times a day (QID) | INTRAMUSCULAR | Status: DC | PRN

## 2012-09-03 MED ORDER — MIRTAZAPINE 15 MG PO TABS
7.5000 mg | ORAL_TABLET | Freq: Every day | ORAL | Status: DC
  Administered 2012-09-03: 7.5 mg via ORAL
  Filled 2012-09-03 (×2): qty 1

## 2012-09-03 MED ORDER — ALPRAZOLAM 1 MG PO TABS
1.0000 mg | ORAL_TABLET | Freq: Three times a day (TID) | ORAL | Status: DC
  Administered 2012-09-03 – 2012-09-04 (×3): 1 mg via ORAL
  Filled 2012-09-03 (×5): qty 1

## 2012-09-03 MED ORDER — OXYCODONE HCL 5 MG PO TABS
5.0000 mg | ORAL_TABLET | ORAL | Status: DC | PRN
  Administered 2012-09-03 – 2012-09-04 (×3): 5 mg via ORAL
  Filled 2012-09-03 (×3): qty 1

## 2012-09-03 MED ORDER — HEPARIN SODIUM (PORCINE) 5000 UNIT/ML IJ SOLN
5000.0000 [IU] | Freq: Three times a day (TID) | INTRAMUSCULAR | Status: DC
  Administered 2012-09-03 – 2012-09-04 (×3): 5000 [IU] via SUBCUTANEOUS
  Filled 2012-09-03 (×3): qty 1

## 2012-09-03 MED ORDER — TIOTROPIUM BROMIDE MONOHYDRATE 18 MCG IN CAPS
18.0000 ug | ORAL_CAPSULE | Freq: Every day | RESPIRATORY_TRACT | Status: DC
  Administered 2012-09-04: 18 ug via RESPIRATORY_TRACT
  Filled 2012-09-03: qty 5

## 2012-09-03 MED ORDER — ACETAMINOPHEN 650 MG RE SUPP: 650.0000 mg | Freq: Four times a day (QID) | RECTAL | Status: DC | PRN

## 2012-09-03 MED ORDER — SIMVASTATIN 20 MG PO TABS
40.0000 mg | ORAL_TABLET | Freq: Every day | ORAL | Status: DC
  Administered 2012-09-03: 40 mg via ORAL
  Filled 2012-09-03: qty 2

## 2012-09-03 MED ORDER — ALBUTEROL SULFATE (5 MG/ML) 0.5% IN NEBU: 2.5000 mg | INHALATION_SOLUTION | RESPIRATORY_TRACT | Status: DC | PRN

## 2012-09-03 MED ORDER — PREDNISONE 20 MG PO TABS
20.0000 mg | ORAL_TABLET | Freq: Every day | ORAL | Status: DC
  Administered 2012-09-03 – 2012-09-04 (×2): 20 mg via ORAL
  Filled 2012-09-03 (×2): qty 1

## 2012-09-03 MED ORDER — PANTOPRAZOLE SODIUM 40 MG PO TBEC
40.0000 mg | DELAYED_RELEASE_TABLET | Freq: Every day | ORAL | Status: DC
  Administered 2012-09-03 – 2012-09-04 (×2): 40 mg via ORAL
  Filled 2012-09-03 (×2): qty 1

## 2012-09-03 MED ORDER — ONDANSETRON HCL 4 MG PO TABS: 4.0000 mg | ORAL_TABLET | Freq: Four times a day (QID) | ORAL | Status: DC | PRN

## 2012-09-03 MED ORDER — MOMETASONE FURO-FORMOTEROL FUM 100-5 MCG/ACT IN AERO
2.0000 | INHALATION_SPRAY | Freq: Two times a day (BID) | RESPIRATORY_TRACT | Status: DC
  Administered 2012-09-03 – 2012-09-04 (×2): 2 via RESPIRATORY_TRACT
  Filled 2012-09-03: qty 8.8

## 2012-09-03 NOTE — ED Notes (Signed)
Dr. Phillips Odor in assessing patient at this time.
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
## 2012-09-03 NOTE — ED Notes (Signed)
Patient requesting something for chest and back pain.

## 2012-09-03 NOTE — Progress Notes (Signed)
     Subjective: This lady was admitted yesterday with 4 separate syncopal episodes. When I questioned her, she says that these episodes last only 1-2 minutes. Her son witnessed at least one of them and she tells me that there was no evidence of shaking her limbs. She feels fine now and appears to be much improved. Serial troponin levels are negative.           Physical Exam: Blood pressure 118/68, pulse 92, temperature 97.8 F (36.6 C), temperature source Oral, resp. rate 20, height 5\' 4"  (1.626 m), weight 70 kg (154 lb 5.2 oz), SpO2 96.00%. She looks systemically well. Heart sounds are normal with no gallop rhythm. There are no carotid bruits. Lung fields are clear. She is alert and orientated. There are no focal neurological signs. Abdomen is soft nontender.   Investigations:     Basic Metabolic Panel:  [facility] 09/02/12 2158 09/01/12 0413  NA 140 140  K 3.9 3.8  CL 102 104  CO2 29 --  GLUCOSE 115* 110*  BUN 15 19  CREATININE 0.77 1.20*  CALCIUM 9.9 --  MG -- --  PHOS -- --   Liver Function Tests:  [facility] 09/02/12 2158  AST 35  ALT 21  ALKPHOS 85  BILITOT 0.2*  PROT 7.7  ALBUMIN 3.8     CBC:  Basename 09/02/12 2158 09/01/12 0413 09/01/12 0359  WBC 17.3* -- 9.7  NEUTROABS 13.9* -- 7.0  HGB 13.0 13.9 --  HCT 40.4 41.0 --  MCV 91.2 -- 90.2  PLT 301 -- 274    Dg Chest Port 1 View  09/02/2012  *RADIOLOGY REPORT*  Clinical Data: Shortness of breath.  Smoker.  PORTABLE CHEST - 1 VIEW  Comparison: 09/01/2012  Findings: Shallow inspiration with elevation of left hemidiaphragm. The heart size and pulmonary vascularity are normal. The lungs appear clear and expanded without focal air space disease or consolidation. No blunting of the costophrenic angles. Emphysematous changes in the lungs.  Fibrosis or linear atelectasis in the lung bases.  Increased opacity over the lung bases is consistent with soft tissue attenuation. No pneumothorax. Mediastinal contours  appear intact.  IMPRESSION: Emphysematous changes in the lungs.  Fibrosis or linear atelectasis in the lung bases.  No active consolidation.   Original Report Authenticated By: Burman Nieves, M.D.       Medications: I have reviewed the patient's current medications.  Impression: 1. Syncope, unclear etiology. I think this is probably related to polypharmacy. According to her history, there has been confusion about the pills that she is taking and she has taken more of her pills of one particular medication in the past. 2. Hypertension. 3. COPD, stable. 4. Tobacco use, ongoing     Plan: 1. Continue to monitor in the hospital. Await carotid Dopplers. I am not sure echocardiogram would be very helpful at this stage bearing in mind that her cardiac examination is entirely normal.     LOS: 1 day   Wilson Singer Pager [phone]  09/03/2012, 9:44 AM

## 2012-09-04 MED ORDER — PREGABALIN 50 MG PO CAPS: 50.0000 mg | ORAL_CAPSULE | Freq: Every day | ORAL | Status: DC

## 2012-09-04 MED ORDER — TIOTROPIUM BROMIDE MONOHYDRATE 18 MCG IN CAPS: 18.0000 ug | ORAL_CAPSULE | Freq: Every day | RESPIRATORY_TRACT | Status: AC

## 2012-09-04 NOTE — Discharge Summary (Signed)
Physician Discharge Summary  Allison Park:[id number] DOB: 1951/04/10 DOA: 09/02/2012  PCP: Jeri Cos, MD  Admit date: 09/02/2012 Discharge date: 09/04/2012  Time spent: Greater than 30 minutes  Recommendations for Outpatient Follow-up:  1. Follow with Dr. Bascom Levels, primary care physician in 3 weeks.  Discharge Diagnoses:  1. Syncopal episodes, likely secondary to medication confusion. No evidence of myocardial ischemia or infarction, no evidence of CVA. 2. Bipolar disorder. 3. Hypertension. 4. COPD, stable. 5. Tobacco abuse.   Discharge Condition: Stable and improved.  Diet recommendation: Heart healthy.  Filed Weights   09/02/12 2120 09/03/12 0214  Weight: 68.04 kg (150 lb) 70 kg (154 lb 5.2 oz)    History of present illness:  This 62 year old lady presented to the hospital with symptoms of syncope. Please see initial history as outlined below: HPI: Allison Park is a 62 y.o. female with advanced COPD, hypertension, hyperlipidemia, remote breast cancer and poorly controlled bipolar disorder who has been seen in the [facility] ED 3 times in the last week and once at [facility] ED for issues related to shortness of breath and COPD, she came back to the ED because she reports having had 4 episodes of syncope today. These episodes were witnessed by her son who is at the bedside and provides history-he described them as if she were falling asleep and then jumps and wakes up. He reports that she had a severe reaction to a new depression medication started by her psychiatrist called Symbax a few days ago-she had hallucinations and according to son the patient was "talking out of her head". In addition to the new medication she is on opiates, benzos, lyrica, muscle relaxants and mirtazapine at night. Patient says she stopped taking all of her medications except for mirtazapine and xanax because she didn't know what was causing her problems. Per her son the patient often forgets that she  takes her medications and then repeats the dose taking more than what is prescribed. Currently she denies and auditory or visual hallucinations, she just feels weak and still has persistent chest pressure and tightness with a cough and body aches. She still on oral steroids but has completed her antibiotics.  Hospital Course:  The patient was admitted and observed on telemetry. There are no arrhythmias. Serial troponin levels were negative. Bilateral carotid Dopplers were negative for any significant stenosis. Her medications were felt to be the cause of her syncopal episodes. Her medications have been adjusted. Her son, who lives with her, will ensure that she takes medications as prescribed. She did not have any syncopal episodes in the hospital. She is stable for discharge. She will follow with her primary care physician in 3 weeks, she already has an appointment.  Procedures:  None.  Consultations:  None.  Discharge Exam: Filed Vitals:   09/03/12 1859 09/03/12 2018 09/04/12 0615 09/04/12 0734  BP:  117/75 130/82   Pulse:  84 103   Temp:  97.3 F (36.3 C) 97.7 F (36.5 C)   TempSrc:  Oral Oral   Resp:  16 18   Height:      Weight:      SpO2: 93% 90% 92% 93%    General: She looks systemically well. She is alert and orientated. Cardiovascular: Heart sounds are present without any murmurs or gallop rhythm. There are no carotid bruits. Respiratory: Lung fields are clear. There is no wheezing or crackles or bronchial breathing.  Discharge Instructions  Discharge Orders    Future Orders Please Complete  By Expires   Diet - low sodium heart healthy      Increase activity slowly          Medication List     As of 09/04/2012  8:34 AM    STOP taking these medications         olanzapine-FLUoxetine 6-25 MG per capsule   Commonly known as: SYMBYAX      predniSONE 20 MG tablet   Commonly known as: DELTASONE      TAKE these medications         albuterol (2.5 MG/3ML) 0.083%  nebulizer solution   Commonly known as: PROVENTIL   Take 2.5 mg by nebulization every 6 (six) hours as needed. For shortness of breath      ALEVE 220 MG tablet   Generic drug: naproxen sodium   Take 220 mg by mouth 2 (two) times daily as needed. Headache.      ALPRAZolam 1 MG tablet   Commonly known as: XANAX   Take 1 mg by mouth 3 (three) times daily as needed. Anxiety.      chlorpheniramine-HYDROcodone 10-8 MG/5ML Lqcr   Commonly known as: TUSSIONEX   Take 5 mLs by mouth every 12 (twelve) hours as needed (Cough).      Fluticasone-Salmeterol 100-50 MCG/DOSE Aepb   Commonly known as: ADVAIR   Inhale 1 puff into the lungs every 12 (twelve) hours.      guaiFENesin 600 MG 12 hr tablet   Commonly known as: MUCINEX   Take 600 mg by mouth 2 (two) times daily.      mirtazapine 7.5 MG tablet   Commonly known as: REMERON   Take 7.5 mg by mouth at bedtime.      multivitamin with minerals Tabs   Take 1 tablet by mouth daily.      oxyCODONE-acetaminophen 5-325 MG per tablet   Commonly known as: PERCOCET/ROXICET   Take 1 tablet by mouth every 4 (four) hours as needed. For pain      pregabalin 50 MG capsule   Commonly known as: LYRICA   Take 1 capsule (50 mg total) by mouth daily.      simvastatin 40 MG tablet   Commonly known as: ZOCOR   Take 40 mg by mouth at bedtime.      tiotropium 18 MCG inhalation capsule   Commonly known as: SPIRIVA   Place 1 capsule (18 mcg total) into inhaler and inhale daily.      triamterene-hydrochlorothiazide 37.5-25 MG per tablet   Commonly known as: MAXZIDE-25   Take 1 tablet by mouth daily.          The results of significant diagnostics from this hospitalization (including imaging, microbiology, ancillary and laboratory) are listed below for reference.    Significant Diagnostic Studies: Dg Chest 2 View  09/01/2012  *RADIOLOGY REPORT*  Clinical Data: Chest pain.  Shortness of breath.  CHEST - 2 VIEW  Comparison: 08/27/2012  Findings:  Normal heart size and pulmonary vascularity.  Diffuse emphysematous changes and scattered fibrosis in the lungs.  Soft tissue attenuation in the chest.  No focal airspace consolidation. No blunting of costophrenic angles.  No pneumothorax. Postoperative changes in the cervical spine.  Stable appearance since previous study.  IMPRESSION: Diffuse emphysematous change.  No evidence of active pulmonary disease.   Original Report Authenticated By: Burman Nieves, M.D.    Dg Chest 2 View  08/27/2012  *RADIOLOGY REPORT*  Clinical Data: In  CHEST - 2 VIEW  Comparison: 06/17/2012.  Findings: Bilateral calcified breast implants are present.  There is no airspace disease.  Bilateral pleural apical thickening is present.  Hyperinflation compatible with emphysema.  The cardiopericardial silhouette appears within normal limits. Monitoring leads are projected over the chest.  Lower cervical ACDF.  Linear subsegmental atelectasis or scarring at the left lung base.  IMPRESSION: No evidence of pneumonia.  Emphysema.   Original Report Authenticated By: Andreas Newport, M.D.    Ct Head Wo Contrast  09/03/2012  *RADIOLOGY REPORT*  Clinical Data: Dizziness and weakness for 2 days.  Syncope. Hypertension.  Pulmonary fibrosis.  Tobacco use disorder.  CT HEAD WITHOUT CONTRAST  Technique:  Contiguous axial images were obtained from the base of the skull through the vertex without contrast.  Comparison: None.  Findings: There is no evidence for acute infarction, intracranial hemorrhage, mass lesion, hydrocephalus, or extra-axial fluid. There is mild atrophy and mild to moderate chronic microvascular ischemic change.  Scattered chronic lacunes are noted.  No large vessel infarction is seen.  There are no worrisome osseous lesions. Carotid atherosclerosis noted in the siphon regions.  Air-fluid level in the right division of the sphenoid sinus could suggest acuity.  Prior bilateral maxillary sinus surgery.  IMPRESSION: Chronic changes as  described.  No visible acute infarct or hemorrhage.  Possible acute sphenoid sinusitis.   Original Report Authenticated By: Davonna Belling, M.D.    Ct Angio Chest Pe W/cm &/or Wo Cm  09/01/2012  *RADIOLOGY REPORT*  Clinical Data: Sharp upper back pain.  Positive D-dimer.  History of breast cancer and mastectomy.  CT ANGIOGRAPHY CHEST  Technique:  Multidetector CT imaging of the chest using the standard protocol during bolus administration of intravenous contrast. Multiplanar reconstructed images including MIPs were obtained and reviewed to evaluate the vascular anatomy.  Contrast: OMNIPAQUE IOHEXOL 350 MG/ML SOLN  Comparison: None.  Findings: Technically adequate study with good opacification of the central and segmental pulmonary arteries.  No focal filling defects are demonstrated.  No evidence of significant pulmonary embolus.  Normal heart size.  Normal caliber thoracic aorta.  No significant lymphadenopathy in the chest.  The esophagus is mostly decompressed.  Visualized portions of the upper abdominal organs are grossly unremarkable.  Bilateral breast implants.  Diffuse emphysematous changes and interstitial fibrosis in the lungs.  Peribronchial thickening suggesting chronic bronchitis. The airways appear patent.  There are linear areas of scarring or atelectasis in the anterior lungs bilaterally.  No focal consolidation.  Scattered calcified granulomas in the lungs.  No pneumothorax.  No pleural effusions.  Degenerative changes in the thoracic spine.  No vertebral compression or destructive bone lesion.  Postoperative change in the cervical spine.  IMPRESSION: No evidence of pulmonary embolus.  Diffuse emphysema and scattered fibrosis in the lungs.   Original Report Authenticated By: Burman Nieves, M.D.    US Carotid Duplex Bilateral  09/03/2012  *RADIOLOGY REPORT*  Clinical Data: Syncopal episode, history of visual the surgeon, hypertension, smoking  BILATERAL CAROTID DUPLEX ULTRASOUND  Technique:  Gray scale imaging, color Doppler and duplex ultrasound was performed of bilateral carotid and vertebral arteries in the neck.  Comparison:  None.  Criteria:  Quantification of carotid stenosis is based on velocity parameters that correlate the residual internal carotid diameter with NASCET-based stenosis levels, using the diameter of the distal internal carotid lumen as the denominator for stenosis measurement.  The following velocity measurements were obtained:                   PEAK SYSTOLIC/END  DIASTOLIC RIGHT ICA:                        119/39cm/sec CCA:                        85/14cm/sec SYSTOLIC ICA/CCA RATIO:     1.4 DIASTOLIC ICA/CCA RATIO:    2.11 ECA:                        102cm/sec  LEFT ICA:                        110/29cm/sec CCA:                        98/23cm/sec SYSTOLIC ICA/CCA RATIO:     1.24 DIASTOLIC ICA/CCA RATIO:    1.26 ECA:                        64cm/sec  Findings:  RIGHT CAROTID ARTERY: There is eccentric slightly hyperechoic plaque within the right carotid bulb (image 20) which extends to involve the origin of proximal aspect of the right internal carotid artery (image 27) which results in borderline elevated peak systolic velocity within the proximal aspect of the right internal carotid artery (measuring 119 cm/sec, image 29).  RIGHT VERTEBRAL ARTERY:  Antegrade flow  LEFT CAROTID ARTERY: There is a moderate amount of mixed echogenic plaque within the left carotid bulb (image 53 and 54) which extends to involve the origin and proximal aspect of the left internal carotid artery (image 63) but does not result in elevated peak systolic velocity within the left internal carotid artery.  LEFT VERTEBRAL ARTERY:  Antegrade flow  IMPRESSION:  Bilateral atherosclerotic plaque results in borderline elevated peak systolic velocities within the right internal carotid artery which approaches the 50% luminal narrowing range.  No definite hemodynamically significant stenosis within the left carotid  system. Irregular or ulcerative plaque is not excluded on the basis of the examination.  Further evaluation with CTA may be performed as clinically indicated.   Original Report Authenticated By: Tacey Ruiz, MD    Dg Chest Port 1 View  09/02/2012  *RADIOLOGY REPORT*  Clinical Data: Shortness of breath.  Smoker.  PORTABLE CHEST - 1 VIEW  Comparison: 09/01/2012  Findings: Shallow inspiration with elevation of left hemidiaphragm. The heart size and pulmonary vascularity are normal. The lungs appear clear and expanded without focal air space disease or consolidation. No blunting of the costophrenic angles. Emphysematous changes in the lungs.  Fibrosis or linear atelectasis in the lung bases.  Increased opacity over the lung bases is consistent with soft tissue attenuation. No pneumothorax. Mediastinal contours appear intact.  IMPRESSION: Emphysematous changes in the lungs.  Fibrosis or linear atelectasis in the lung bases.  No active consolidation.   Original Report Authenticated By: Burman Nieves, M.D.         Labs: Basic Metabolic Panel:  Lab 09/02/12 1610 09/01/12 0413  NA 140 140  K 3.9 3.8  CL 102 104  CO2 29 --  GLUCOSE 115* 110*  BUN 15 19  CREATININE 0.77 1.20*  CALCIUM 9.9 --  MG -- --  PHOS -- --   Liver Function Tests:  Lab 09/02/12 2158  AST 35  ALT 21  ALKPHOS 85  BILITOT 0.2*  PROT 7.7  ALBUMIN 3.8  CBC:  Lab 09/02/12 2158 09/01/12 0413 09/01/12 0359  WBC 17.3* -- 9.7  NEUTROABS 13.9* -- 7.0  HGB 13.0 13.9 13.2  HCT 40.4 41.0 40.4  MCV 91.2 -- 90.2  PLT 301 -- 274   Cardiac Enzymes:  Lab 09/03/12 1956 09/03/12 1201 09/03/12 0413 09/02/12 2158  CKTOTAL -- -- -- --  CKMB -- -- -- --  CKMBINDEX -- -- -- --  TROPONINI <0.30 <0.30 <0.30 <0.30   BNP: BNP (last 3 results)  Basename 09/03/12 0413  PROBNP 278.5*         Signed:  Michelena Culmer C  [facility] 09/04/2012, 8:34 AM

## 2012-09-15 ENCOUNTER — Emergency Department (HOSPITAL_COMMUNITY)
Admission: EM | Admit: 2012-09-15 | Discharge: 2012-09-15 | Disposition: A | Discharge status: Home / Self Care | Payer: 59

## 2012-09-15 NOTE — ED Notes (Signed)
Pt here to have her home meds replaced.  Director spoke with pt, and advised to remove from system.

## 2012-09-16 ENCOUNTER — Emergency Department (HOSPITAL_COMMUNITY)
Admission: EM | Admit: 2012-09-16 | Discharge: 2012-09-16 | Disposition: A | Discharge status: Home / Self Care | Payer: 59 | Attending: Emergency Medicine | Admitting: Emergency Medicine

## 2012-09-16 ENCOUNTER — Encounter (HOSPITAL_COMMUNITY): Payer: Self-pay | Admitting: *Deleted

## 2012-09-16 ENCOUNTER — Emergency Department (HOSPITAL_COMMUNITY): Payer: 59

## 2012-09-16 DIAGNOSIS — J449 Chronic obstructive pulmonary disease, unspecified: Secondary | ICD-10-CM

## 2012-09-16 DIAGNOSIS — E785 Hyperlipidemia, unspecified: Secondary | ICD-10-CM | POA: Insufficient documentation

## 2012-09-16 DIAGNOSIS — Z79899 Other long term (current) drug therapy: Secondary | ICD-10-CM | POA: Insufficient documentation

## 2012-09-16 DIAGNOSIS — Z760 Encounter for issue of repeat prescription: Secondary | ICD-10-CM | POA: Insufficient documentation

## 2012-09-16 DIAGNOSIS — I10 Essential (primary) hypertension: Secondary | ICD-10-CM | POA: Insufficient documentation

## 2012-09-16 DIAGNOSIS — J441 Chronic obstructive pulmonary disease with (acute) exacerbation: Secondary | ICD-10-CM | POA: Insufficient documentation

## 2012-09-16 DIAGNOSIS — Z791 Long term (current) use of non-steroidal anti-inflammatories (NSAID): Secondary | ICD-10-CM | POA: Insufficient documentation

## 2012-09-16 DIAGNOSIS — F17200 Nicotine dependence, unspecified, uncomplicated: Secondary | ICD-10-CM | POA: Insufficient documentation

## 2012-09-16 DIAGNOSIS — R059 Cough, unspecified: Secondary | ICD-10-CM | POA: Insufficient documentation

## 2012-09-16 DIAGNOSIS — R05 Cough: Secondary | ICD-10-CM | POA: Insufficient documentation

## 2012-09-16 DIAGNOSIS — Z853 Personal history of malignant neoplasm of breast: Secondary | ICD-10-CM | POA: Insufficient documentation

## 2012-09-16 MED ORDER — ALPRAZOLAM 1 MG PO TABS: 1.0000 mg | ORAL_TABLET | Freq: Three times a day (TID) | ORAL | Status: DC | PRN

## 2012-09-16 MED ORDER — TRIAMTERENE-HCTZ 37.5-25 MG PO TABS: 1.0000 | ORAL_TABLET | Freq: Every day | ORAL | Status: AC

## 2012-09-16 MED ORDER — MIRTAZAPINE 7.5 MG PO TABS: 7.5000 mg | ORAL_TABLET | Freq: Every day | ORAL | Status: DC

## 2012-09-16 MED ORDER — ALBUTEROL SULFATE (5 MG/ML) 0.5% IN NEBU
5.0000 mg | INHALATION_SOLUTION | Freq: Once | RESPIRATORY_TRACT | Status: AC
  Administered 2012-09-16: 5 mg via RESPIRATORY_TRACT
  Filled 2012-09-16: qty 1

## 2012-09-16 NOTE — ED Notes (Signed)
Pt given breathing treatment, normal rise and fall of chest.

## 2012-09-16 NOTE — ED Notes (Signed)
Pt stated able breathing is better at the moment

## 2012-09-16 NOTE — ED Notes (Signed)
Pt stated she is feeling much better, and that she just needs her meds refilled, primarily her xanax.  Thinks she lost it during her last hospital visit. Pt has 02 for home use and has chronic copd.

## 2012-09-16 NOTE — ED Provider Notes (Signed)
History     CSN: [id number]  Arrival date & time 09/16/12  1632   First MD Initiated Contact with Patient 09/16/12 1736      Chief Complaint  Patient presents with  . Shortness of Breath    (Consider location/radiation/quality/duration/timing/severity/associated sxs/prior treatment) Patient is a 62 y.o. female presenting with shortness of breath. The history is provided by the patient.  Shortness of Breath  Associated symptoms include cough and wheezing. Pertinent negatives include no chest pain and no fever.  pt states was txd for bronchitis/pna d/cd from hospital 1/19.  States feels much improved but cough not resolved. Feels wheezy at times. Hx copd. Uses mdi/neb prn. Used 1 x today. Also states out of a few of her meds, which she lost/did not get when she left the hospital. Denies any chest pain or discomfort. No fever or chills. Is eating and drinking normally. No nv. Smoker. Denies leg pain or swelling. No orthopnea or pnd.     Past Medical History  Diagnosis Date  . Hypertension   . Hyperlipemia   . COPD (chronic obstructive pulmonary disease)   . Cancer     Past Surgical History  Procedure Date  . Mastectomy   . Abdominal hysterectomy   . Back surgery   . Breast surgery   . Breast surgery     History reviewed. No pertinent family history.  History  Substance Use Topics  . Smoking status: Current Every Day Smoker -- 0.5 packs/day    Types: Cigarettes    Last Attempt to Quit: 08/19/2012  . Smokeless tobacco: Not on file  . Alcohol Use: No    OB History    Grav Para Term Preterm Abortions TAB SAB Ect Mult Living                  Review of Systems  Constitutional: Negative for fever and chills.  HENT: Negative for neck pain.   Eyes: Negative for redness.  Respiratory: Positive for cough and wheezing.   Cardiovascular: Negative for chest pain and leg swelling.  Gastrointestinal: Negative for abdominal pain.  Genitourinary: Negative for flank pain.   Musculoskeletal: Negative for back pain.  Skin: Negative for rash.  Neurological: Negative for headaches.  Hematological: Does not bruise/bleed easily.  Psychiatric/Behavioral: Negative for confusion. The patient is not nervous/anxious.     Allergies  Penicillins  Home Medications   Current Outpatient Rx  Name  Route  Sig  Dispense  Refill  . ALBUTEROL SULFATE (2.5 MG/3ML) 0.083% IN NEBU   Nebulization   Take 2.5 mg by nebulization every 6 (six) hours as needed. For shortness of breath         . ALPRAZOLAM 1 MG PO TABS   Oral   Take 1 mg by mouth 3 (three) times daily as needed. Anxiety.         . GUAIFENESIN ER 600 MG PO TB12   Oral   Take 600 mg by mouth 2 (two) times daily.         Marland Kitchen MIRTAZAPINE 7.5 MG PO TABS   Oral   Take 7.5 mg by mouth at bedtime.         . ADULT MULTIVITAMIN W/MINERALS CH   Oral   Take 1 tablet by mouth daily.         Marland Kitchen NAPROXEN SODIUM 220 MG PO TABS   Oral   Take 220 mg by mouth 2 (two) times daily as needed. Headache.         Marland Kitchen  OXYCODONE-ACETAMINOPHEN 5-325 MG PO TABS   Oral   Take 1 tablet by mouth every 4 (four) hours as needed. For pain         . PREGABALIN 50 MG PO CAPS   Oral   Take 1 capsule (50 mg total) by mouth daily.   30 capsule   0   . SIMVASTATIN 40 MG PO TABS   Oral   Take 40 mg by mouth at bedtime.          Marland Kitchen TIOTROPIUM BROMIDE MONOHYDRATE 18 MCG IN CAPS   Inhalation   Place 1 capsule (18 mcg total) into inhaler and inhale daily.   30 capsule   0   . TRIAMTERENE-HCTZ 37.5-25 MG PO TABS   Oral   Take 1 tablet by mouth daily.           BP 137/80  Pulse 80  Temp 99.5 F (37.5 C) (Oral)  Resp 16  Ht 5\' 4"  (1.626 m)  Wt 150 lb (68.04 kg)  BMI 25.75 kg/m2  SpO2 97%  Physical Exam  Nursing note and vitals reviewed. Constitutional: She is oriented to person, place, and time. She appears well-developed and well-nourished. No distress.  HENT:  Mouth/Throat: Oropharynx is clear and moist.   Eyes: Conjunctivae normal are normal. No scleral icterus.  Neck: Neck supple. No JVD present. No tracheal deviation present.  Cardiovascular: Normal rate, regular rhythm, normal heart sounds and intact distal pulses.   Pulmonary/Chest: Effort normal. No respiratory distress.       Mild exp wheezing bil.   Abdominal: Soft. Normal appearance and bowel sounds are normal. She exhibits no distension. There is no tenderness.  Musculoskeletal: She exhibits no edema and no tenderness.  Neurological: She is alert and oriented to person, place, and time.  Skin: Skin is warm and dry. No rash noted.  Psychiatric: She has a normal mood and affect.    ED Course  Procedures (including critical care time)  Results for orders placed during the hospital encounter of 09/02/12  CBC WITH DIFFERENTIAL      Component Value Range   WBC 17.3 (*) 4.0 - 10.5 K/uL   RBC 4.43  3.87 - 5.11 MIL/uL   Hemoglobin 13.0  12.0 - 15.0 g/dL   HCT 16.1  09.6 - 04.5 %   MCV 91.2  78.0 - 100.0 fL   MCH 29.3  26.0 - 34.0 pg   MCHC 32.2  30.0 - 36.0 g/dL   RDW 40.9  81.1 - 91.4 %   Platelets 301  150 - 400 K/uL   Neutrophils Relative 80 (*) 43 - 77 %   Neutro Abs 13.9 (*) 1.7 - 7.7 K/uL   Lymphocytes Relative 13  12 - 46 %   Lymphs Abs 2.2  0.7 - 4.0 K/uL   Monocytes Relative 7  3 - 12 %   Monocytes Absolute 1.2 (*) 0.1 - 1.0 K/uL   Eosinophils Relative 0  0 - 5 %   Eosinophils Absolute 0.0  0.0 - 0.7 K/uL   Basophils Relative 0  0 - 1 %   Basophils Absolute 0.0  0.0 - 0.1 K/uL  COMPREHENSIVE METABOLIC PANEL      Component Value Range   Sodium 140  135 - 145 mEq/L   Potassium 3.9  3.5 - 5.1 mEq/L   Chloride 102  96 - 112 mEq/L   CO2 29  19 - 32 mEq/L   Glucose, Bld 115 (*) 70 - 99 mg/dL  BUN 15  6 - 23 mg/dL   Creatinine, Ser 4.78  0.50 - 1.10 mg/dL   Calcium 9.9  8.4 - 29.5 mg/dL   Total Protein 7.7  6.0 - 8.3 g/dL   Albumin 3.8  3.5 - 5.2 g/dL   AST 35  0 - 37 U/L   ALT 21  0 - 35 U/L   Alkaline  Phosphatase 85  39 - 117 U/L   Total Bilirubin 0.2 (*) 0.3 - 1.2 mg/dL   GFR calc non Af Amer 89 (*) >90 mL/min   GFR calc Af Amer >90  >90 mL/min  TROPONIN I      Component Value Range   Troponin I <0.30  <0.30 ng/mL  BLOOD GAS, ARTERIAL      Component Value Range   O2 Content 1.5     Delivery systems NASAL CANNULA     pH, Arterial 7.453 (*) 7.350 - 7.450   pCO2 arterial 39.9  35.0 - 45.0 mmHg   pO2, Arterial 70.4 (*) 80.0 - 100.0 mmHg   Bicarbonate 27.5 (*) 20.0 - 24.0 mEq/L   TCO2 24.3  0 - 100 mmol/L   Acid-Base Excess 3.7 (*) 0.0 - 2.0 mmol/L   O2 Saturation 94.8     Patient temperature 37.0     Collection site LEFT RADIAL     Drawn by COLLECTED BY RT     Sample type ARTERIAL     Allens test (pass/fail) PASS  PASS  PRO B NATRIURETIC PEPTIDE      Component Value Range   Pro B Natriuretic peptide (BNP) 278.5 (*) 0 - 125 pg/mL  TSH      Component Value Range   TSH 0.652  0.350 - 4.500 uIU/mL  TROPONIN I      Component Value Range   Troponin I <0.30  <0.30 ng/mL  TROPONIN I      Component Value Range   Troponin I <0.30  <0.30 ng/mL  TROPONIN I      Component Value Range   Troponin I <0.30  <0.30 ng/mL   Dg Chest 2 View  09/16/2012  *RADIOLOGY REPORT*  Clinical Data: Cough and chest congestion.  CHEST - 2 VIEW  Comparison: 09/02/2012  Findings: Pulmonary emphysema again demonstrated.  Mild bibasilar scarring is stable.  No evidence of pulmonary infiltrate or edema. No evidence of pleural effusion.  Heart size is normal.  No mass or lymphadenopathy identified.  IMPRESSION: Emphysema.  No active disease.   Original Report Authenticated By: Myles Rosenthal, M.D.    Dg Chest 2 View  09/01/2012  *RADIOLOGY REPORT*  Clinical Data: Chest pain.  Shortness of breath.  CHEST - 2 VIEW  Comparison: 08/27/2012  Findings: Normal heart size and pulmonary vascularity.  Diffuse emphysematous changes and scattered fibrosis in the lungs.  Soft tissue attenuation in the chest.  No focal airspace  consolidation. No blunting of costophrenic angles.  No pneumothorax. Postoperative changes in the cervical spine.  Stable appearance since previous study.  IMPRESSION: Diffuse emphysematous change.  No evidence of active pulmonary disease.   Original Report Authenticated By: Burman Nieves, M.D.    Dg Chest 2 View  08/27/2012  *RADIOLOGY REPORT*  Clinical Data: In  CHEST - 2 VIEW  Comparison: 06/17/2012.  Findings: Bilateral calcified breast implants are present.  There is no airspace disease.  Bilateral pleural apical thickening is present.  Hyperinflation compatible with emphysema.  The cardiopericardial silhouette appears within normal limits. Monitoring leads are projected over the chest.  Lower cervical ACDF.  Linear subsegmental atelectasis or scarring at the left lung base.  IMPRESSION: No evidence of pneumonia.  Emphysema.   Original Report Authenticated By: Andreas Newport, M.D.    Ct Head Wo Contrast  09/03/2012  *RADIOLOGY REPORT*  Clinical Data: Dizziness and weakness for 2 days.  Syncope. Hypertension.  Pulmonary fibrosis.  Tobacco use disorder.  CT HEAD WITHOUT CONTRAST  Technique:  Contiguous axial images were obtained from the base of the skull through the vertex without contrast.  Comparison: None.  Findings: There is no evidence for acute infarction, intracranial hemorrhage, mass lesion, hydrocephalus, or extra-axial fluid. There is mild atrophy and mild to moderate chronic microvascular ischemic change.  Scattered chronic lacunes are noted.  No large vessel infarction is seen.  There are no worrisome osseous lesions. Carotid atherosclerosis noted in the siphon regions.  Air-fluid level in the right division of the sphenoid sinus could suggest acuity.  Prior bilateral maxillary sinus surgery.  IMPRESSION: Chronic changes as described.  No visible acute infarct or hemorrhage.  Possible acute sphenoid sinusitis.   Original Report Authenticated By: Davonna Belling, M.D.    Ct Angio Chest Pe W/cm  &/or Wo Cm  09/01/2012  *RADIOLOGY REPORT*  Clinical Data: Sharp upper back pain.  Positive D-dimer.  History of breast cancer and mastectomy.  CT ANGIOGRAPHY CHEST  Technique:  Multidetector CT imaging of the chest using the standard protocol during bolus administration of intravenous contrast. Multiplanar reconstructed images including MIPs were obtained and reviewed to evaluate the vascular anatomy.  Contrast: OMNIPAQUE IOHEXOL 350 MG/ML SOLN  Comparison: None.  Findings: Technically adequate study with good opacification of the central and segmental pulmonary arteries.  No focal filling defects are demonstrated.  No evidence of significant pulmonary embolus.  Normal heart size.  Normal caliber thoracic aorta.  No significant lymphadenopathy in the chest.  The esophagus is mostly decompressed.  Visualized portions of the upper abdominal organs are grossly unremarkable.  Bilateral breast implants.  Diffuse emphysematous changes and interstitial fibrosis in the lungs.  Peribronchial thickening suggesting chronic bronchitis. The airways appear patent.  There are linear areas of scarring or atelectasis in the anterior lungs bilaterally.  No focal consolidation.  Scattered calcified granulomas in the lungs.  No pneumothorax.  No pleural effusions.  Degenerative changes in the thoracic spine.  No vertebral compression or destructive bone lesion.  Postoperative change in the cervical spine.  IMPRESSION: No evidence of pulmonary embolus.  Diffuse emphysema and scattered fibrosis in the lungs.   Original Report Authenticated By: Burman Nieves, M.D.    US Carotid Duplex Bilateral  09/03/2012  *RADIOLOGY REPORT*  Clinical Data: Syncopal episode, history of visual the surgeon, hypertension, smoking  BILATERAL CAROTID DUPLEX ULTRASOUND  Technique: Gray scale imaging, color Doppler and duplex ultrasound was performed of bilateral carotid and vertebral arteries in the neck.  Comparison:  None.  Criteria:   Quantification of carotid stenosis is based on velocity parameters that correlate the residual internal carotid diameter with NASCET-based stenosis levels, using the diameter of the distal internal carotid lumen as the denominator for stenosis measurement.  The following velocity measurements were obtained:                   PEAK SYSTOLIC/END DIASTOLIC RIGHT ICA:                        119/39cm/sec CCA:  85/14cm/sec SYSTOLIC ICA/CCA RATIO:     1.4 DIASTOLIC ICA/CCA RATIO:    2.11 ECA:                        102cm/sec  LEFT ICA:                        110/29cm/sec CCA:                        98/23cm/sec SYSTOLIC ICA/CCA RATIO:     1.24 DIASTOLIC ICA/CCA RATIO:    1.26 ECA:                        64cm/sec  Findings:  RIGHT CAROTID ARTERY: There is eccentric slightly hyperechoic plaque within the right carotid bulb (image 20) which extends to involve the origin of proximal aspect of the right internal carotid artery (image 27) which results in borderline elevated peak systolic velocity within the proximal aspect of the right internal carotid artery (measuring 119 cm/sec, image 29).  RIGHT VERTEBRAL ARTERY:  Antegrade flow  LEFT CAROTID ARTERY: There is a moderate amount of mixed echogenic plaque within the left carotid bulb (image 53 and 54) which extends to involve the origin and proximal aspect of the left internal carotid artery (image 63) but does not result in elevated peak systolic velocity within the left internal carotid artery.  LEFT VERTEBRAL ARTERY:  Antegrade flow  IMPRESSION:  Bilateral atherosclerotic plaque results in borderline elevated peak systolic velocities within the right internal carotid artery which approaches the 50% luminal narrowing range.  No definite hemodynamically significant stenosis within the left carotid system. Irregular or ulcerative plaque is not excluded on the basis of the examination.  Further evaluation with CTA may be performed as clinically indicated.    Original Report Authenticated By: Tacey Ruiz, MD    Dg Chest Port 1 View  09/02/2012  *RADIOLOGY REPORT*  Clinical Data: Shortness of breath.  Smoker.  PORTABLE CHEST - 1 VIEW  Comparison: 09/01/2012  Findings: Shallow inspiration with elevation of left hemidiaphragm. The heart size and pulmonary vascularity are normal. The lungs appear clear and expanded without focal air space disease or consolidation. No blunting of the costophrenic angles. Emphysematous changes in the lungs.  Fibrosis or linear atelectasis in the lung bases.  Increased opacity over the lung bases is consistent with soft tissue attenuation. No pneumothorax. Mediastinal contours appear intact.  IMPRESSION: Emphysematous changes in the lungs.  Fibrosis or linear atelectasis in the lung bases.  No active consolidation.   Original Report Authenticated By: Burman Nieves, M.D.       MDM  Cxr.  Pt states out of meds including remeron, maxzide, xanax.  States has adequate of other meds. States has appt w pcp in 1 week.   Sl wheezing. Alb neb.  Recheck no increased wob. Pt states feels ready for d/c.         Suzi Roots, MD 09/16/12 1945

## 2012-09-16 NOTE — ED Notes (Signed)
Recent adm for pneumonia, d/c 1/19,  Cont to be sob, nonproductive cough, no fever. Pt is out of her meds. Since Wednesday.  Her MD  Is out of town.  Pt says  Her meds were not returned when she was admitted here.  Came here yesterday about her meds. And told to call her MD, but he is out of town.

## 2012-09-16 NOTE — ED Notes (Signed)
Dr.steinl to see pt.

## 2012-09-16 NOTE — ED Notes (Signed)
Pt returned today for re-eval of pneumonia, stated she is out of meds and that at last hospitalization she did not get her meds returned.  Pt was at hosp. Yesterday and was referred to ED director for assistance with meds. Pt was to follow up w/ PMD. For medication refill.

## 2012-09-16 NOTE — ED Notes (Signed)
Discharge instructions reviewed with pt, questions answered. Pt verbalized understanding.  

## 2012-10-12 NOTE — Progress Notes (Signed)
UR Chart Review Completed  

## 2013-06-18 ENCOUNTER — Emergency Department (HOSPITAL_COMMUNITY)
Admission: EM | Admit: 2013-06-18 | Discharge: 2013-06-18 | Discharge status: Against Medical Advice | Payer: 59 | Attending: Emergency Medicine | Admitting: Emergency Medicine

## 2013-06-18 ENCOUNTER — Encounter (HOSPITAL_COMMUNITY): Payer: Self-pay | Admitting: Emergency Medicine

## 2013-06-18 DIAGNOSIS — R079 Chest pain, unspecified: Secondary | ICD-10-CM | POA: Insufficient documentation

## 2013-06-18 DIAGNOSIS — F411 Generalized anxiety disorder: Secondary | ICD-10-CM | POA: Insufficient documentation

## 2013-06-18 HISTORY — DX: Anxiety disorder, unspecified: F41.9

## 2013-06-18 NOTE — ED Notes (Signed)
No answer in waiting room 

## 2013-06-18 NOTE — ED Notes (Addendum)
Ptc/o mid center chest pain that started yesterday, denies any n/v/sob, diaphoresis, states that her doctor moved to [place] last month, was writing prescriptions for pt for xanax 1mg  tablet tid, has ran out of her medication, last dose 09/30/214, anxiety symptoms became worse yesterday, pt also requesting referral to neurologist for her fibromyalgia because there is not a neurologist in her area accepting new patients.

## 2013-06-18 NOTE — ED Notes (Signed)
Registration advised that pt has left, no answer in waiting room,

## 2013-06-25 ENCOUNTER — Encounter (HOSPITAL_COMMUNITY): Payer: Self-pay | Admitting: Emergency Medicine

## 2013-06-25 ENCOUNTER — Emergency Department (HOSPITAL_COMMUNITY)
Admission: EM | Admit: 2013-06-25 | Discharge: 2013-06-25 | Disposition: A | Discharge status: Home / Self Care | Payer: 59 | Attending: Emergency Medicine | Admitting: Emergency Medicine

## 2013-06-25 DIAGNOSIS — I10 Essential (primary) hypertension: Secondary | ICD-10-CM | POA: Insufficient documentation

## 2013-06-25 DIAGNOSIS — F419 Anxiety disorder, unspecified: Secondary | ICD-10-CM

## 2013-06-25 DIAGNOSIS — J4489 Other specified chronic obstructive pulmonary disease: Secondary | ICD-10-CM | POA: Insufficient documentation

## 2013-06-25 DIAGNOSIS — Z8719 Personal history of other diseases of the digestive system: Secondary | ICD-10-CM | POA: Insufficient documentation

## 2013-06-25 DIAGNOSIS — R0602 Shortness of breath: Secondary | ICD-10-CM | POA: Insufficient documentation

## 2013-06-25 DIAGNOSIS — N644 Mastodynia: Secondary | ICD-10-CM | POA: Insufficient documentation

## 2013-06-25 DIAGNOSIS — F319 Bipolar disorder, unspecified: Secondary | ICD-10-CM | POA: Insufficient documentation

## 2013-06-25 DIAGNOSIS — Z91199 Patient's noncompliance with other medical treatment and regimen due to unspecified reason: Secondary | ICD-10-CM | POA: Insufficient documentation

## 2013-06-25 DIAGNOSIS — F410 Panic disorder [episodic paroxysmal anxiety] without agoraphobia: Secondary | ICD-10-CM | POA: Insufficient documentation

## 2013-06-25 DIAGNOSIS — Z853 Personal history of malignant neoplasm of breast: Secondary | ICD-10-CM | POA: Insufficient documentation

## 2013-06-25 DIAGNOSIS — E785 Hyperlipidemia, unspecified: Secondary | ICD-10-CM | POA: Insufficient documentation

## 2013-06-25 DIAGNOSIS — R61 Generalized hyperhidrosis: Secondary | ICD-10-CM | POA: Insufficient documentation

## 2013-06-25 DIAGNOSIS — Z79899 Other long term (current) drug therapy: Secondary | ICD-10-CM | POA: Insufficient documentation

## 2013-06-25 DIAGNOSIS — J449 Chronic obstructive pulmonary disease, unspecified: Secondary | ICD-10-CM | POA: Insufficient documentation

## 2013-06-25 DIAGNOSIS — Z880 Allergy status to penicillin: Secondary | ICD-10-CM | POA: Insufficient documentation

## 2013-06-25 DIAGNOSIS — Z87891 Personal history of nicotine dependence: Secondary | ICD-10-CM | POA: Insufficient documentation

## 2013-06-25 DIAGNOSIS — G8929 Other chronic pain: Secondary | ICD-10-CM | POA: Insufficient documentation

## 2013-06-25 DIAGNOSIS — Z9119 Patient's noncompliance with other medical treatment and regimen: Secondary | ICD-10-CM | POA: Insufficient documentation

## 2013-06-25 MED ORDER — LORAZEPAM 1 MG PO TABS: 1.0000 mg | ORAL_TABLET | Freq: Three times a day (TID) | ORAL | Status: DC | PRN

## 2013-06-25 NOTE — ED Provider Notes (Signed)
CSN: [id number]     Arrival date & time 06/25/13  0915 History  This chart was scribed for Flint Melter, MD by Quintella Reichert, ED scribe.  This patient was seen in room APA07/APA07 and the patient's care was started at 9:35 AM.   Chief Complaint  Patient presents with  . Medication Refill  . Anxiety    The history is provided by the patient and a relative. No language interpreter was used.    HPI Comments: Allison Park is a 62 y.o. female with h/o advanced COPD, hypertension, hyperlipidemia, remote breast cancer, poorly controlled bipolar disorder, and anxiety who presents to the Emergency Department complaining of bilateral breast pain and anxiety and requesting refills of her medication which she ran out of since losing her PCP.  Pt states "I have a lot of pain in my breast due to breast cancer, and I've had 11 surgeries."  Son reports that "the first doctor who did them over-filled them" and she has had multiple infections since then.  Her last infection was in January.  Pt also complains of severe anxiety and recurrent panic attacks.  Her son states that she wakes up in the middle of the night sweating and short of breath and she tells him she is having panic attacks.    Pt is requesting refills on her Xanax, amphetamine salts and Percocet.  Her PCP recently moved and she has not been able to find another who is accepting new patients and would accept new patients.  She went to a clinic today and was advised to come to the ED.   Pt uses CPAP at night which she was placed on by a pulmonologist in [place].  She quit smoking January of this year after being diagnosed with double-pneumonia.     Patient Active Problem List   Diagnosis Date Noted  . Syncope 09/03/2012  . Bipolar 1 disorder 09/03/2012  . HTN (hypertension) 09/03/2012  . HLD (hyperlipidemia) 09/03/2012  . COPD (chronic obstructive pulmonary disease) 09/03/2012  . Pulmonary fibrosis 09/03/2012  . GERD (gastroesophageal  reflux disease) 09/03/2012  . Medication adverse effect 09/03/2012  . Tobacco abuse 09/03/2012    Past Medical History  Diagnosis Date  . Hypertension   . Hyperlipemia   . COPD (chronic obstructive pulmonary disease)   . Cancer   . Anxiety     Past Surgical History  Procedure Laterality Date  . Mastectomy    . Abdominal hysterectomy    . Back surgery    . Breast surgery    . Breast surgery      No family history on file.   History  Substance Use Topics  . Smoking status: Former Smoker -- 0.50 packs/day    Types: Cigarettes    Quit date: 08/19/2012  . Smokeless tobacco: Not on file  . Alcohol Use: No    OB History   Grav Para Term Preterm Abortions TAB SAB Ect Mult Living                  Review of Systems  Constitutional: Positive for diaphoresis (during panic attacks).  Respiratory: Positive for shortness of breath (during panic attacks).   Musculoskeletal:       Bilateral breast pain  Psychiatric/Behavioral: The patient is nervous/anxious.        Panic attacks  All other systems reviewed and are negative.     Allergies  Penicillins  Home Medications   Current Outpatient Rx  Name  Route  Sig  Dispense  Refill  . albuterol (PROVENTIL) (2.5 MG/3ML) 0.083% nebulizer solution   Nebulization   Take 2.5 mg by nebulization every 6 (six) hours as needed. For shortness of breath         . ALPRAZolam (XANAX) 1 MG tablet   Oral   Take 1 mg by mouth 3 (three) times daily as needed. Anxiety.         . ALPRAZolam (XANAX) 1 MG tablet   Oral   Take 1 tablet (1 mg total) by mouth 3 (three) times daily as needed for anxiety.   20 tablet   0   . guaiFENesin (MUCINEX) 600 MG 12 hr tablet   Oral   Take 600 mg by mouth 2 (two) times daily.         Marland Kitchen LORazepam (ATIVAN) 1 MG tablet   Oral   Take 1 tablet (1 mg total) by mouth every 8 (eight) hours as needed for anxiety.   30 tablet   0   . mirtazapine (REMERON) 7.5 MG tablet   Oral   Take 7.5 mg by  mouth at bedtime.         . mirtazapine (REMERON) 7.5 MG tablet   Oral   Take 1 tablet (7.5 mg total) by mouth at bedtime.   10 tablet   0   . Multiple Vitamin (MULTIVITAMIN WITH MINERALS) TABS   Oral   Take 1 tablet by mouth daily.         . naproxen sodium (ALEVE) 220 MG tablet   Oral   Take 220 mg by mouth 2 (two) times daily as needed. Headache.         . oxyCODONE-acetaminophen (PERCOCET/ROXICET) 5-325 MG per tablet   Oral   Take 1 tablet by mouth every 4 (four) hours as needed. For pain         . pregabalin (LYRICA) 50 MG capsule   Oral   Take 1 capsule (50 mg total) by mouth daily.   30 capsule   0   . simvastatin (ZOCOR) 40 MG tablet   Oral   Take 40 mg by mouth at bedtime.          Marland Kitchen tiotropium (SPIRIVA) 18 MCG inhalation capsule   Inhalation   Place 1 capsule (18 mcg total) into inhaler and inhale daily.   30 capsule   0   . triamterene-hydrochlorothiazide (MAXZIDE-25) 37.5-25 MG per tablet   Oral   Take 1 tablet by mouth daily.         Marland Kitchen triamterene-hydrochlorothiazide (MAXZIDE-25) 37.5-25 MG per tablet   Oral   Take 1 each (1 tablet total) by mouth daily.   10 tablet   0    BP 173/109  Pulse 102  Temp(Src) 98.4 F (36.9 C) (Oral)  Resp 16  Ht 5\' 4"  (1.626 m)  Wt 140 lb (63.504 kg)  BMI 24.02 kg/m2  SpO2 98%  Physical Exam  Nursing note and vitals reviewed. Constitutional: She is oriented to person, place, and time. She appears well-developed.  Appears older than stated age  HENT:  Head: Normocephalic and atraumatic.  Eyes: Conjunctivae and EOM are normal. Pupils are equal, round, and reactive to light.  Neck: Normal range of motion and phonation normal. Neck supple.  Cardiovascular: Normal rate, regular rhythm and intact distal pulses.   Pulmonary/Chest: Effort normal and breath sounds normal. She exhibits no tenderness.  Musculoskeletal: Normal range of motion.  Neurological: She is alert and oriented to person, place, and  time. She exhibits normal muscle tone.  Skin: Skin is warm and dry.  Psychiatric: Her behavior is normal. Judgment and thought content normal.  Anxious    ED Course  Procedures (including critical care time)   Patient Vitals for the past 24 hrs:  BP Temp Temp src Pulse Resp SpO2 Height Weight  06/25/13 0925 173/109 mmHg 98.4 F (36.9 C) Oral 102 16 98 % 5\' 4"  (1.626 m) 140 lb (63.504 kg)    DIAGNOSTIC STUDIES: Oxygen Saturation is 98% on room air, normal by my interpretation.    COORDINATION OF CARE: 9:41 AM-Explained to pt that her medications cannot be refilled or prescribed in the ED but will prescribe a limited number of Ativan tablets and provide a resource guide to find a PCP.  Pt expressed understanding and agreed with plan.    MDM   1. Anxiety   2. Chronic pain    Anxiety and chronic pain with medication noncompliance, and no access to primary care, currently. I did not believe that she needs emergency refills of oxycodone alprazolam or amphetamines. I consented to give her short-term course of lorazepam. She was encouraged to followup with a PCP as soon as possible for further care and management   Nursing Notes Reviewed/ Care Coordinated, and agree without changes. Applicable Imaging Reviewed.  Interpretation of Laboratory Data incorporated into ED treatment   Plan: Home Medications-  lorazepam #30 ; Home Treatments and Observation- rest; return here if the recommended treatment, does not improve the symptoms; Recommended follow up- PCP of choice prn     I personally performed the services described in this documentation, which was scribed in my presence. The recorded information has been reviewed and is accurate.      Flint Melter, MD 06/25/13 [phone]

## 2013-06-25 NOTE — ED Notes (Signed)
Pt presents to er with c/o bilateral breast pain, states "I have had 11 surgeries and have continued breast pain", reports that she has been out of her pain medication for a month due to her pcp leaving, also c/io anxiety, has been out of her xanax and medication for her adhd for a month as well.

## 2013-07-27 ENCOUNTER — Emergency Department (HOSPITAL_COMMUNITY)
Admission: EM | Admit: 2013-07-27 | Discharge: 2013-07-27 | Disposition: A | Discharge status: Home / Self Care | Payer: 59 | Attending: Emergency Medicine | Admitting: Emergency Medicine

## 2013-07-27 ENCOUNTER — Encounter (HOSPITAL_COMMUNITY): Payer: Self-pay | Admitting: Emergency Medicine

## 2013-07-27 DIAGNOSIS — Z87891 Personal history of nicotine dependence: Secondary | ICD-10-CM | POA: Insufficient documentation

## 2013-07-27 DIAGNOSIS — F411 Generalized anxiety disorder: Secondary | ICD-10-CM | POA: Insufficient documentation

## 2013-07-27 DIAGNOSIS — R209 Unspecified disturbances of skin sensation: Secondary | ICD-10-CM | POA: Insufficient documentation

## 2013-07-27 DIAGNOSIS — J441 Chronic obstructive pulmonary disease with (acute) exacerbation: Secondary | ICD-10-CM | POA: Insufficient documentation

## 2013-07-27 DIAGNOSIS — I10 Essential (primary) hypertension: Secondary | ICD-10-CM | POA: Insufficient documentation

## 2013-07-27 DIAGNOSIS — R0789 Other chest pain: Secondary | ICD-10-CM | POA: Insufficient documentation

## 2013-07-27 DIAGNOSIS — IMO0002 Reserved for concepts with insufficient information to code with codable children: Secondary | ICD-10-CM | POA: Insufficient documentation

## 2013-07-27 DIAGNOSIS — Z79899 Other long term (current) drug therapy: Secondary | ICD-10-CM | POA: Insufficient documentation

## 2013-07-27 DIAGNOSIS — Z859 Personal history of malignant neoplasm, unspecified: Secondary | ICD-10-CM | POA: Insufficient documentation

## 2013-07-27 DIAGNOSIS — E785 Hyperlipidemia, unspecified: Secondary | ICD-10-CM | POA: Insufficient documentation

## 2013-07-27 DIAGNOSIS — Z880 Allergy status to penicillin: Secondary | ICD-10-CM | POA: Insufficient documentation

## 2013-07-27 DIAGNOSIS — F419 Anxiety disorder, unspecified: Secondary | ICD-10-CM

## 2013-07-27 MED ORDER — LORAZEPAM 1 MG PO TABS: 1.0000 mg | ORAL_TABLET | Freq: Three times a day (TID) | ORAL | Status: DC | PRN

## 2013-07-27 MED ORDER — LORAZEPAM 1 MG PO TABS
1.0000 mg | ORAL_TABLET | Freq: Once | ORAL | Status: AC
  Administered 2013-07-27: 1 mg via ORAL
  Filled 2013-07-27: qty 1
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
## 2013-07-27 NOTE — ED Notes (Signed)
Pt alert & oriented x4, stable gait. Patient given discharge instructions, paperwork & prescription(s). Patient  instructed to stop at the registration desk to finish any additional paperwork. Patient verbalized understanding. Pt left department w/ no further questions. 

## 2013-07-27 NOTE — ED Notes (Signed)
Pt states she is here for her anxiety and panic attack. Pt states psychiatrist left town in December and is out of her xanax and Careers information officer.

## 2013-09-07 ENCOUNTER — Emergency Department (HOSPITAL_COMMUNITY)
Admission: EM | Admit: 2013-09-07 | Discharge: 2013-09-07 | Disposition: A | Discharge status: Home / Self Care | Payer: 59 | Attending: Emergency Medicine | Admitting: Emergency Medicine

## 2013-09-07 ENCOUNTER — Encounter (HOSPITAL_COMMUNITY): Payer: Self-pay | Admitting: Emergency Medicine

## 2013-09-07 DIAGNOSIS — Z79899 Other long term (current) drug therapy: Secondary | ICD-10-CM | POA: Insufficient documentation

## 2013-09-07 DIAGNOSIS — R209 Unspecified disturbances of skin sensation: Secondary | ICD-10-CM | POA: Insufficient documentation

## 2013-09-07 DIAGNOSIS — G8929 Other chronic pain: Secondary | ICD-10-CM | POA: Insufficient documentation

## 2013-09-07 DIAGNOSIS — Z87891 Personal history of nicotine dependence: Secondary | ICD-10-CM | POA: Insufficient documentation

## 2013-09-07 DIAGNOSIS — I10 Essential (primary) hypertension: Secondary | ICD-10-CM | POA: Insufficient documentation

## 2013-09-07 DIAGNOSIS — IMO0002 Reserved for concepts with insufficient information to code with codable children: Secondary | ICD-10-CM | POA: Insufficient documentation

## 2013-09-07 DIAGNOSIS — F419 Anxiety disorder, unspecified: Secondary | ICD-10-CM

## 2013-09-07 DIAGNOSIS — Z760 Encounter for issue of repeat prescription: Secondary | ICD-10-CM | POA: Insufficient documentation

## 2013-09-07 DIAGNOSIS — E785 Hyperlipidemia, unspecified: Secondary | ICD-10-CM | POA: Insufficient documentation

## 2013-09-07 DIAGNOSIS — Z8739 Personal history of other diseases of the musculoskeletal system and connective tissue: Secondary | ICD-10-CM | POA: Insufficient documentation

## 2013-09-07 DIAGNOSIS — R0602 Shortness of breath: Secondary | ICD-10-CM | POA: Insufficient documentation

## 2013-09-07 DIAGNOSIS — Z880 Allergy status to penicillin: Secondary | ICD-10-CM | POA: Insufficient documentation

## 2013-09-07 DIAGNOSIS — J449 Chronic obstructive pulmonary disease, unspecified: Secondary | ICD-10-CM | POA: Insufficient documentation

## 2013-09-07 DIAGNOSIS — J4489 Other specified chronic obstructive pulmonary disease: Secondary | ICD-10-CM | POA: Insufficient documentation

## 2013-09-07 DIAGNOSIS — F411 Generalized anxiety disorder: Secondary | ICD-10-CM | POA: Insufficient documentation

## 2013-09-07 DIAGNOSIS — R079 Chest pain, unspecified: Secondary | ICD-10-CM | POA: Insufficient documentation

## 2013-09-07 DIAGNOSIS — Z859 Personal history of malignant neoplasm, unspecified: Secondary | ICD-10-CM | POA: Insufficient documentation

## 2013-09-07 MED ORDER — LORAZEPAM 1 MG PO TABS: 1.0000 mg | ORAL_TABLET | Freq: Three times a day (TID) | ORAL | Status: DC | PRN

## 2013-09-07 NOTE — ED Notes (Signed)
Pt c/o of some cp 2/10.  Pt reports "my lungs hurt".

## 2013-09-07 NOTE — ED Notes (Signed)
Pt reports being here to get some xanax before she has her lung surgery on 1/28.  Pt reports having a regular prescription for xanax but reports that her psychiatrist moved away.  Pt reports taking her last pill over a month ago.  Pt reports severe anxiety d/t the upcoming surgery.

## 2013-09-07 NOTE — ED Provider Notes (Signed)
CSN: [id number]     Arrival date & time 09/07/13  2025 History   First MD Initiated Contact with Patient 09/07/13 0940     Chief Complaint  Patient presents with  . Medication Refill  . Anxiety   HPI Pt was seen at 1000. Per pt, c/o gradual onset and persistence of constant acute flair of her chronic anxiety and panic attacks for the past several months. Pt states her symptoms began after she ran out of her xanax 3+ months ago. States she is here today for a refill of her anxiety medication. Pt describes her anxiety as feeling "shaky," SOB, tingling in her hands and chest pain (chronic). Denies any change in her usual symptom pattern. Denies palpitations, no cough, no fevers, no abd pain, no N/V/D. Denies SI, no HI, no hallucinations.     Past Medical History  Diagnosis Date  . Hypertension   . Hyperlipemia   . COPD (chronic obstructive pulmonary disease)   . Cancer   . Anxiety    Past Surgical History  Procedure Laterality Date  . Mastectomy    . Abdominal hysterectomy    . Back surgery    . Breast surgery    . Breast surgery      History  Substance Use Topics  . Smoking status: Former Smoker -- 0.50 packs/day    Types: Cigarettes    Quit date: 08/19/2012  . Smokeless tobacco: Not on file  . Alcohol Use: No    Review of Systems ROS: Statement: All systems negative except as marked or noted in the HPI; Constitutional: Negative for fever and chills. ; ; Eyes: Negative for eye pain, redness and discharge. ; ; ENMT: Negative for ear pain, hoarseness, nasal congestion, sinus pressure and sore throat. ; ; Cardiovascular: Negative for chest pain, palpitations, diaphoresis, dyspnea and peripheral edema. ; ; Respiratory: Negative for cough, wheezing and stridor. ; ; Gastrointestinal: Negative for nausea, vomiting, diarrhea, abdominal pain, blood in stool, hematemesis, jaundice and rectal bleeding. . ; ; Genitourinary: Negative for dysuria, flank pain and hematuria. ; ; Musculoskeletal:  Negative for back pain and neck pain. Negative for swelling and trauma.; ; Skin: Negative for pruritus, rash, abrasions, blisters, bruising and skin lesion.; ; Neuro: Negative for headache, lightheadedness and neck stiffness. Negative for weakness, altered level of consciousness , altered mental status, extremity weakness, paresthesias, involuntary movement, seizure and syncope.; Psych:  +anxiety, panic attack. No SI, no SA, no HI, no hallucinations.        Allergies  Penicillins  Home Medications   Current Outpatient Rx  Name  Route  Sig  Dispense  Refill  . albuterol (PROVENTIL HFA;VENTOLIN HFA) 108 (90 BASE) MCG/ACT inhaler   Inhalation   Inhale 2 puffs into the lungs every 6 (six) hours as needed for wheezing or shortness of breath.         Marland Kitchen albuterol (PROVENTIL) (2.5 MG/3ML) 0.083% nebulizer solution   Nebulization   Take 2.5 mg by nebulization every 6 (six) hours as needed. For shortness of breath         . ALPRAZolam (XANAX) 1 MG tablet   Oral   Take 1 tablet (1 mg total) by mouth 3 (three) times daily as needed for anxiety.   20 tablet   0   . BROVANA 15 MCG/2ML NEBU   Nebulization   Take 2 mLs by nebulization daily.         . budesonide-formoterol (SYMBICORT) 160-4.5 MCG/ACT inhaler   Inhalation   Inhale  2 puffs into the lungs 2 (two) times daily.         Marland Kitchen guaiFENesin (MUCINEX) 600 MG 12 hr tablet   Oral   Take 600 mg by mouth 2 (two) times daily.         Marland Kitchen LORazepam (ATIVAN) 1 MG tablet   Oral   Take 1 tablet (1 mg total) by mouth every 8 (eight) hours as needed for anxiety.   15 tablet   0   . LORazepam (ATIVAN) 1 MG tablet   Oral   Take 1 tablet (1 mg total) by mouth 3 (three) times daily as needed for anxiety.   15 tablet   0   . mirtazapine (REMERON) 7.5 MG tablet   Oral   Take 1 tablet (7.5 mg total) by mouth at bedtime.   10 tablet   0   . Multiple Vitamin (MULTIVITAMIN WITH MINERALS) TABS   Oral   Take 1 tablet by mouth daily.          . naproxen sodium (ALEVE) 220 MG tablet   Oral   Take 220 mg by mouth 2 (two) times daily as needed. Headache.         Marland Kitchen OLANZapine-FLUoxetine (SYMBYAX) 6-25 MG per capsule   Oral   Take 1 capsule by mouth daily.         Marland Kitchen OVER THE COUNTER MEDICATION   Both Eyes   Place 2 drops into both eyes daily.         . pregabalin (LYRICA) 50 MG capsule   Oral   Take 1 capsule (50 mg total) by mouth daily.   30 capsule   0   . simvastatin (ZOCOR) 40 MG tablet   Oral   Take 40 mg by mouth at bedtime.          Marland Kitchen tiotropium (SPIRIVA) 18 MCG inhalation capsule   Inhalation   Place 1 capsule (18 mcg total) into inhaler and inhale daily.   30 capsule   0   . triamterene-hydrochlorothiazide (MAXZIDE-25) 37.5-25 MG per tablet   Oral   Take 1 each (1 tablet total) by mouth daily.   10 tablet   0    BP 165/98  Temp(Src) 97.9 F (36.6 C) (Oral)  Resp 20  SpO2 96% Physical Exam 1005: Physical examination:  Nursing notes reviewed; Vital signs and O2 SAT reviewed;  Constitutional: Well developed, Well nourished, Well hydrated, In no acute distress; Head:  Normocephalic, atraumatic; Eyes: EOMI, PERRL, No scleral icterus; ENMT: Mouth and pharynx normal, Mucous membranes moist; Neck: Supple, Full range of motion, No lymphadenopathy; Cardiovascular: Regular rate and rhythm, No gallop; Respiratory: Breath sounds clear & equal bilaterally, No rales, rhonchi, wheezes.  Speaking full sentences with ease, Normal respiratory effort/excursion; Chest: Nontender, Movement normal; Abdomen: Soft, Nontender, Nondistended, Normal bowel sounds; Genitourinary: No CVA tenderness; Extremities: Pulses normal, No tenderness, No edema, No calf edema or asymmetry.; Neuro: AA&Ox3, Major CN grossly intact.  Speech clear. No gross focal motor or sensory deficits in extremities. Climbs on and off stretcher easily by herself. Gait steady.; Skin: Color normal, Warm, Dry.; Psych:  Anxious.    ED Course   Procedures   EKG Interpretation    Date/Time:  Thursday September 07 2013 09:36:17 EST Ventricular Rate:  100 PR Interval:  134 QRS Duration: 82 QT Interval:  386 QTC Calculation: 497 R Axis:   75 Text Interpretation:  Normal sinus rhythm Possible Left atrial enlargement Prolonged QT Abnormal ECG When compared with  ECG of 18-Jun-2013 13:20, No significant change was found Confirmed by [facility]  MD, Nunzio Cory [phone]) on 09/07/2013 10:16:05 AM            MDM  MDM Reviewed: previous chart, nursing note and vitals Reviewed previous: ECG Interpretation: ECG    1010:  Long hx of chronic anxiety with multiple ED visits for same.  Pt endorses acute flair of her usual long standing chronic anxiety today, no change from her usual chronic anxiety symptoms.  Pt encouraged to f/u with her PMD and mental health provider for good continuity of care and control of her chronic symptoms.  Verb understanding.       Alfonzo Feller, DO 09/10/13 1143

## 2013-09-07 NOTE — Discharge Instructions (Signed)
°Emergency Department Resource Guide °1) Find a Doctor and Pay Out of Pocket °Although you won't have to find out who is covered by your insurance plan, it is a good idea to ask around and get recommendations. You will then need to call the office and see if the doctor you have chosen will accept you as a new patient and what types of options they offer for patients who are self-pay. Some doctors offer discounts or will set up payment plans for their patients who do not have insurance, but you will need to ask so you aren't surprised when you get to your appointment. ° °2) Contact Your Local Health Department °Not all health departments have doctors that can see patients for sick visits, but many do, so it is worth a call to see if yours does. If you don't know where your local health department is, you can check in your phone book. The CDC also has a tool to help you locate your state's health department, and many state websites also have listings of all of their local health departments. ° °3) Find a Walk-in Clinic °If your illness is not likely to be very severe or complicated, you may want to try a walk in clinic. These are popping up all over the country in pharmacies, drugstores, and shopping centers. They're usually staffed by nurse practitioners or physician assistants that have been trained to treat common illnesses and complaints. They're usually fairly quick and inexpensive. However, if you have serious medical issues or chronic medical problems, these are probably not your best option. ° °No Primary Care Doctor: °- Call [facility] at  832-8000 - they can help you locate a primary care doctor that  accepts your insurance, provides certain services, etc. °- Physician Referral Service- 1-800-533-3463 ° °Chronic Pain Problems: °Organization         Address  Phone   Notes  °[facility]  (336) 297-2271 Patients need to be referred by their primary care doctor.  ° °Medication  Assistance: °Organization         Address  Phone   Notes  °[place] [facility] [address] °[place], [place] [address] (336) 641-8030 --Must be a resident of [place] °-- Must have NO insurance coverage whatsoever (no Medicaid/ Medicare, etc.) °-- The pt. MUST have a primary care doctor that directs their care regularly and follows them in the community °  °[facility]  (866) 331-1348   °[employer]  (888) 892-1162   ° °Agencies that provide inexpensive medical care: °Organization         Address  Phone   Notes  °[facility]  (336) 832-8035   ° Internal Medicine    (336) 832-7272   °[facility] [address] °[place], [place] [address] (336) 832-4777   °[facility] [address], °[place] (336) 271-4999   °[facility]    (336) 373-0678   °[facility]    (336) 272-1050   °[facility] ° [address], [place] Phone:  (336) 832-4444, Fax:  (336) 832-4440 Hours of Operation:  9 am - 6 pm, M-F.  Also accepts Medicaid/Medicare and self-pay.  °[facility] ° [address],  Phone: (336) 832-3150, Fax: (336) 832-3151. Hours of Operation:  8:30 am - 5:30 pm, M-F.  Also accepts Medicaid and self-pay.  °[facility] [place] [address], [place] Phone: (336) 878-6027   °[facility] [address], [place], [place] (336)723-1848, Ext. 123 Mondays & Thursdays: 7-9 AM.  First 15 patients are seen on a first come, first serve basis. °  ° °Medicaid-accepting [place] Providers: ° °Organization         Address  Phone   Notes  °[facility] [address], [place] (336) 641-2100 Also accepts self-pay patients.  °[facility] [address], [place] ° (336) 856-9996   °[facility] [address], [place]  (336) 288-8857   °[facility] [address] [place] [address], [place] (336) 299-7000   °Veita Bland [address], [place]  ° (336) 373-1557 Only accepts [place] Access Medicaid patients after they have their name applied to their card.  ° °Self-Pay (no insurance) in [place]: ° °Organization         Address  Phone   Notes  °Sickle Cell Patients, [facility] [address], [place] (336) 832-1970   °[facility] [address], [place] (336) 832-4400   °[facility] ° [address], [place] (336) 992-4800   °[facility]/Dr. Osei-Bonsu ° [address] [place] [address], [place] or [address], [place] (336) 841-8500 Phone number for both [place] and [place] locations is the same.  °[facility] [address], [place] (336) 299-0000   °[facility] [address] [place] [address], [place] or [address] (336) 852-7530 °(336) 878-2260   °[facility] [address], [place] (336) 350-1642, phone; (336) 294-5005, fax Sees patients 1st and 3rd Saturday of every month.  Must not qualify for public or private insurance (i.e. Medicaid, Medicare, [place] Health Choice, Veterans' Benefits) • Household income should be no more than 200% of the poverty level •The clinic cannot treat you if you are pregnant or think you are pregnant • Sexually transmitted diseases are not treated at the clinic.  ° ° °Dental Care: °Organization         Address  Phone  Notes  °[place] [facility] [address], [place] (336) 641-6152 Accepts children up to age 21 who are enrolled in Medicaid or [place] Health Choice; pregnant women with a Medicaid card; and children who have applied for Medicaid or [place] Health Choice, but were declined, whose parents can pay a reduced fee at time of service.  °[place]  [facility] [place]  [address], [place] (336) 641-7733 Accepts children up to age 21 who are enrolled in Medicaid or [place] Health Choice; pregnant women with a Medicaid card; and children who have applied for Medicaid or [place] Health Choice, but were declined, whose parents can pay a reduced fee at time of service.  °[facility] ° [address], [place] (336) 641-4533 Patients are seen by appointment only. Walk-ins are not accepted. [facility] will see patients 18 years of age and older. °Monday - Tuesday (8am-5pm) °Most Wednesdays (8:30-5pm) °$30 per visit, cash only  °[facility] ° [address], [place] (336) 641-4533 Patients are seen by appointment only. Walk-ins are not accepted. [facility] will see patients 18 years of age and older. °One   Wednesday Evening (Monthly: Volunteer Based).  $30 per visit, cash only  °[facility]  (919) 537-3737 for adults; Children under age 4, call Graduate Pediatric Dentistry at (919) [phone]. Children aged 4-14, please call (919) [phone] to request a pediatric application. ° Dental services are provided in all areas of dental care including fillings, crowns and bridges, complete and partial dentures, implants, gum treatment, root canals, and extractions. Preventive care is also provided. Treatment is provided to both adults and children. °Patients are selected via a lottery and there is often a waiting list. °  °[facility] [address], °[place] ° (336) 763-8833 [email or web address] °  °[facility] [address], [place], [place] (336)723-1848, Ext. 123 Second and Fourth Thursday of each month, opens at 6:30 AM; Clinic ends at 9 AM.  Patients are seen on a first-come first-served basis, and a limited number are seen during each clinic.  ° °[facility] ° [address], [place], [place] (336) 723-7904    Eligibility Requirements °You must have lived in [place], [place], or [place] counties for at least the last three months. °  You cannot be eligible for state or federal sponsored healthcare insurance, including Veterans Administration, Medicaid, or Medicare. °  You generally cannot be eligible for healthcare insurance through your employer.  °  How to apply: °Eligibility screenings are held every Tuesday and Wednesday afternoon from 1:00 pm until 4:00 pm. You do not need an appointment for the interview!  °[facility] [address], [place], [place] 336-631-2330   °[facility]  336-342-8273   °[facility]  336-703-3100   °[facility]  336-570-6415   ° °Behavioral Health Resources in the Community: °Intensive Outpatient Programs °Organization         Address  Phone  Notes  °[facility] [address], [place], [place] 336-878-6098   °[facility] [address], [place], [place] 336-832-9800   °[facility] [address], [place], [place] ° 336-882-2125   °[facility] [address],  °[place], [place] 1-800-853-5163 or 336-641-4981   °Substance Abuse Resources °Organization         Address  Phone  Notes  °[facility]  336-882-2125   °[facility]  336-784-9470   °[facility]  336-285-9073   °[facility]  336-845-3988   °Residential & Outpatient Substance Abuse Program  1-800-659-3381   °Psychological Services °Organization         Address  Phone  Notes  ° Health  336- 832-9600   °[facility]  336- 378-7881   °[facility] [address], [place] 1-800-853-5163 or 336-641-4981   ° °Mobile Crisis Teams °Organization         Address  Phone  Notes  °[facility], Mobile Crisis Care Unit  1-877-626-1772   °Assertive °[facility] ° [address]  [place], [place] 336-834-9664   °Sharon DeEsch [address] °[place] 336-554-5454   ° °Self-Help/Support Groups °Organization         Address  Phone             Notes  °[facility] [place] - variety of support groups  336- 373-1402 Call for more information  °Narcotics Anonymous (NA), [facility] [address], °[place]  2 meetings at this location  ° °  Residential Treatment Programs Organization         Address  Phone  Notes  [facility] [address]    [place]  1-606-383-2366   [facility]  [address], [place] [address], [place], [place]   [place], [place] [phone] Admissions: 8am-3pm M-F  [facility] [place] [address],    [place], [place] [phone]   [facility] [address] [place], [place], [place]   [facility] [address],  [place], [place]   [facility] - Intensive Outpatient [address], [place], [place]   [facility] [address]  [place], [place] 1-918 158 9133 or 562-285-6481   [facility] [address], [place], [place] Accepts Medicaid  [facility] [address],  [place] 1-(949)835-3494 Substance Abuse/Addiction Treatment   [place] [facility]         Address  Phone  Notes  [facility]  904-315-8281   Domenic Schwab, PhD [address] [place], [place]   (606)646-1256 or 424-561-6195   [facility] [place], [place] [phone]   [facility] [address] [place], [place] (201)774-7322 Insurance/Medicaid/sponsorship through [facility] [address]                                    [place], [place] [phone] [address], [place] [phone]    Dr. Adele Schilder  ([phone]   [facility] Dept. 1) [address] [place] 2) [place] 3)  [address], [place] [phone]  [phone]   [place] (269)[phone] or [phone] (After Hours)       Take the prescription as directed.  Call your regular medical doctor and the mental health provider today to schedule a follow up appointment within the next week.  Return to the Emergency Department immediately sooner if worsening.

## 2013-09-08 ENCOUNTER — Ambulatory Visit (INDEPENDENT_AMBULATORY_CARE_PROVIDER_SITE_OTHER): Payer: 59 | Admitting: Psychiatry

## 2013-09-08 ENCOUNTER — Encounter (HOSPITAL_COMMUNITY): Payer: Self-pay | Admitting: Psychiatry

## 2013-09-08 VITALS — BP 140/100 | Ht 64.0 in | Wt 131.0 lb

## 2013-09-08 DIAGNOSIS — F411 Generalized anxiety disorder: Secondary | ICD-10-CM

## 2013-09-08 MED ORDER — MIRTAZAPINE 7.5 MG PO TABS: 7.5000 mg | ORAL_TABLET | Freq: Every day | ORAL | Status: DC

## 2013-09-08 MED ORDER — VENLAFAXINE HCL ER 75 MG PO CP24: ORAL_CAPSULE | ORAL | Status: DC

## 2013-09-08 MED ORDER — ALPRAZOLAM 1 MG PO TABS: 1.0000 mg | ORAL_TABLET | Freq: Three times a day (TID) | ORAL | Status: DC
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
## 2013-10-02 ENCOUNTER — Ambulatory Visit (HOSPITAL_COMMUNITY): Payer: Self-pay | Admitting: Psychiatry

## 2013-10-12 ENCOUNTER — Ambulatory Visit (HOSPITAL_COMMUNITY): Payer: Self-pay | Admitting: Psychiatry

## 2013-10-17 ENCOUNTER — Ambulatory Visit (HOSPITAL_COMMUNITY): Payer: Self-pay | Admitting: Psychiatry

## 2013-10-19 ENCOUNTER — Ambulatory Visit (HOSPITAL_COMMUNITY): Payer: Self-pay | Admitting: Psychiatry

## 2013-10-19 ENCOUNTER — Ambulatory Visit (INDEPENDENT_AMBULATORY_CARE_PROVIDER_SITE_OTHER): Payer: 59 | Admitting: Psychiatry

## 2013-10-19 ENCOUNTER — Encounter (HOSPITAL_COMMUNITY): Payer: Self-pay | Admitting: Psychiatry

## 2013-10-19 VITALS — BP 110/70 | Ht 64.0 in | Wt 132.0 lb

## 2013-10-19 DIAGNOSIS — F411 Generalized anxiety disorder: Secondary | ICD-10-CM

## 2013-10-19 DIAGNOSIS — F332 Major depressive disorder, recurrent severe without psychotic features: Secondary | ICD-10-CM

## 2013-10-19 MED ORDER — VENLAFAXINE HCL ER 75 MG PO CP24: ORAL_CAPSULE | ORAL | Status: DC

## 2013-10-19 MED ORDER — ALPRAZOLAM 1 MG PO TABS: 1.0000 mg | ORAL_TABLET | Freq: Three times a day (TID) | ORAL | Status: DC

## 2013-10-19 MED ORDER — MIRTAZAPINE 15 MG PO TABS: 15.0000 mg | ORAL_TABLET | Freq: Every day | ORAL | Status: DC
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
## 2013-12-19 ENCOUNTER — Ambulatory Visit (HOSPITAL_COMMUNITY): Payer: Self-pay | Admitting: Psychiatry

## 2013-12-28 ENCOUNTER — Ambulatory Visit (INDEPENDENT_AMBULATORY_CARE_PROVIDER_SITE_OTHER): Payer: 59 | Admitting: Psychiatry

## 2013-12-28 ENCOUNTER — Encounter (HOSPITAL_COMMUNITY): Payer: Self-pay | Admitting: Psychiatry

## 2013-12-28 VITALS — BP 120/80 | Ht 64.0 in | Wt 124.0 lb

## 2013-12-28 DIAGNOSIS — F411 Generalized anxiety disorder: Secondary | ICD-10-CM

## 2013-12-28 DIAGNOSIS — F332 Major depressive disorder, recurrent severe without psychotic features: Secondary | ICD-10-CM

## 2013-12-28 MED ORDER — MIRTAZAPINE 15 MG PO TABS: 15.0000 mg | ORAL_TABLET | Freq: Every day | ORAL | Status: DC

## 2013-12-28 MED ORDER — VENLAFAXINE HCL ER 75 MG PO CP24: ORAL_CAPSULE | ORAL | Status: DC

## 2013-12-28 MED ORDER — ALPRAZOLAM 1 MG PO TABS: 1.0000 mg | ORAL_TABLET | Freq: Three times a day (TID) | ORAL | Status: DC
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
## 2014-01-16 ENCOUNTER — Telehealth (HOSPITAL_COMMUNITY): Payer: Self-pay | Admitting: *Deleted

## 2014-01-16 NOTE — Telephone Encounter (Signed)
She is one week early, will refill when its due

## 2014-01-22 ENCOUNTER — Inpatient Hospital Stay (HOSPITAL_COMMUNITY)
Admission: EM | Admit: 2014-01-22 | Discharge: 2014-01-23 | DRG: 192 | Disposition: A | Discharge status: Home / Self Care | Payer: Medicare Other | Attending: Internal Medicine | Admitting: Internal Medicine

## 2014-01-22 ENCOUNTER — Emergency Department (HOSPITAL_COMMUNITY): Payer: Medicare Other

## 2014-01-22 ENCOUNTER — Encounter (HOSPITAL_COMMUNITY): Payer: Self-pay | Admitting: Emergency Medicine

## 2014-01-22 DIAGNOSIS — F17200 Nicotine dependence, unspecified, uncomplicated: Secondary | ICD-10-CM | POA: Diagnosis present

## 2014-01-22 DIAGNOSIS — E785 Hyperlipidemia, unspecified: Secondary | ICD-10-CM

## 2014-01-22 DIAGNOSIS — F3289 Other specified depressive episodes: Secondary | ICD-10-CM | POA: Diagnosis present

## 2014-01-22 DIAGNOSIS — E876 Hypokalemia: Secondary | ICD-10-CM | POA: Diagnosis present

## 2014-01-22 DIAGNOSIS — Z79899 Other long term (current) drug therapy: Secondary | ICD-10-CM

## 2014-01-22 DIAGNOSIS — J8410 Pulmonary fibrosis, unspecified: Secondary | ICD-10-CM | POA: Diagnosis present

## 2014-01-22 DIAGNOSIS — Z9981 Dependence on supplemental oxygen: Secondary | ICD-10-CM

## 2014-01-22 DIAGNOSIS — IMO0001 Reserved for inherently not codable concepts without codable children: Secondary | ICD-10-CM | POA: Diagnosis present

## 2014-01-22 DIAGNOSIS — J9620 Acute and chronic respiratory failure, unspecified whether with hypoxia or hypercapnia: Secondary | ICD-10-CM

## 2014-01-22 DIAGNOSIS — J441 Chronic obstructive pulmonary disease with (acute) exacerbation: Secondary | ICD-10-CM

## 2014-01-22 DIAGNOSIS — I10 Essential (primary) hypertension: Secondary | ICD-10-CM

## 2014-01-22 DIAGNOSIS — F319 Bipolar disorder, unspecified: Secondary | ICD-10-CM

## 2014-01-22 DIAGNOSIS — Z720 Tobacco use: Secondary | ICD-10-CM

## 2014-01-22 DIAGNOSIS — F411 Generalized anxiety disorder: Secondary | ICD-10-CM | POA: Diagnosis present

## 2014-01-22 DIAGNOSIS — Z9010 Acquired absence of unspecified breast and nipple: Secondary | ICD-10-CM

## 2014-01-22 DIAGNOSIS — Z853 Personal history of malignant neoplasm of breast: Secondary | ICD-10-CM

## 2014-01-22 DIAGNOSIS — F329 Major depressive disorder, single episode, unspecified: Secondary | ICD-10-CM | POA: Diagnosis present

## 2014-01-22 LAB — CBC WITH DIFFERENTIAL/PLATELET
BASOS PCT: 1 % (ref 0–1)
Basophils Absolute: 0.1 10*3/uL (ref 0.0–0.1)
Eosinophils Absolute: 0.3 10*3/uL (ref 0.0–0.7)
Eosinophils Relative: 3 % (ref 0–5)
HCT: 36.4 % (ref 36.0–46.0)
Hemoglobin: 12.1 g/dL (ref 12.0–15.0)
Lymphocytes Relative: 23 % (ref 12–46)
Lymphs Abs: 2.2 10*3/uL (ref 0.7–4.0)
MCH: 29.5 pg (ref 26.0–34.0)
MCHC: 33.2 g/dL (ref 30.0–36.0)
MCV: 88.8 fL (ref 78.0–100.0)
MONOS PCT: 8 % (ref 3–12)
Monocytes Absolute: 0.8 10*3/uL (ref 0.1–1.0)
NEUTROS PCT: 65 % (ref 43–77)
Neutro Abs: 6 10*3/uL (ref 1.7–7.7)
PLATELETS: 267 10*3/uL (ref 150–400)
RBC: 4.1 MIL/uL (ref 3.87–5.11)
RDW: 13.4 % (ref 11.5–15.5)
WBC: 9.2 10*3/uL (ref 4.0–10.5)

## 2014-01-22 LAB — BASIC METABOLIC PANEL
BUN: 11 mg/dL (ref 6–23)
CO2: 27 meq/L (ref 19–32)
Calcium: 8.9 mg/dL (ref 8.4–10.5)
Chloride: 103 mEq/L (ref 96–112)
Creatinine, Ser: 0.78 mg/dL (ref 0.50–1.10)
GFR calc Af Amer: >90 mL/min (ref >90)
GFR, EST NON AFRICAN AMERICAN: 88 mL/min — AB (ref >90)
Glucose, Bld: 99 mg/dL (ref 70–99)
Potassium: 3.3 mEq/L — ABNORMAL LOW (ref 3.7–5.3)
Sodium: 143 mEq/L (ref 137–147)

## 2014-01-22 MED ORDER — IPRATROPIUM-ALBUTEROL 0.5-2.5 (3) MG/3ML IN SOLN
3.0000 mL | Freq: Once | RESPIRATORY_TRACT | Status: AC
  Administered 2014-01-22: 3 mL via RESPIRATORY_TRACT
  Filled 2014-01-22: qty 3

## 2014-01-22 MED ORDER — SODIUM CHLORIDE 0.9 % IV BOLUS (SEPSIS)
500.0000 mL | Freq: Once | INTRAVENOUS | Status: AC
  Administered 2014-01-22: 500 mL via INTRAVENOUS

## 2014-01-22 MED ORDER — METHYLPREDNISOLONE SODIUM SUCC 125 MG IJ SOLR
125.0000 mg | Freq: Once | INTRAMUSCULAR | Status: AC
  Administered 2014-01-22: 125 mg via INTRAVENOUS
  Filled 2014-01-22: qty 2

## 2014-01-22 MED ORDER — ALBUTEROL SULFATE (2.5 MG/3ML) 0.083% IN NEBU
2.5000 mg | INHALATION_SOLUTION | Freq: Once | RESPIRATORY_TRACT | Status: AC
  Administered 2014-01-22: 2.5 mg via RESPIRATORY_TRACT
  Filled 2014-01-22: qty 3

## 2014-01-22 NOTE — ED Notes (Addendum)
"  I think have double pneumonia"  Fever 103 last pm. And feels sob.  On home 02

## 2014-01-22 NOTE — ED Provider Notes (Signed)
CSN: [id number]     Arrival date & time 01/22/14  2053 History   First MD Initiated Contact with Patient 01/22/14 2118   This chart was scribed for Nat Christen, MD by Anastasia Pall, ED Scribe. This patient was seen in room APA17/APA17 and the patient's care was started at 9:28 PM.  Chief Complaint  Patient presents with  . Shortness of Breath   (Consider location/radiation/quality/duration/timing/severity/associated sxs/prior Treatment) The history is provided by the patient. No language interpreter was used.   HPI Comments: LAZARIAH SAVARD is a 63 y.o. female who presents to the Emergency Department complaining worsening SOB onset several weeks ago, worsened over the past few days with associated cough productive of sputum and wheezing. She states that she has trouble walking due to her wheezing and SOB. Patient thinks she may have pneumonia. She states she has a history of COPD and emphsema. She states she takes 4L of o2 18 hr per day and Spiriva and Albuterol. She denies fever, chills, rusty sputum  Dr. Davina Poke in [place] PCP-  No PCP Per Patient   Past Medical History  Diagnosis Date  . Hypertension   . Hyperlipemia   . COPD (chronic obstructive pulmonary disease)   . Anxiety   . Fibromyalgia   . Cancer    Past Surgical History  Procedure Laterality Date  . Mastectomy    . Abdominal hysterectomy    . Back surgery    . Breast surgery    . Breast surgery    . Tonsillectomy     Family History  Problem Relation Age of Onset  . Depression Maternal Aunt    History  Substance Use Topics  . Smoking status: Light Tobacco Smoker -- 0.50 packs/day    Types: Cigarettes    Last Attempt to Quit: 08/19/2012  . Smokeless tobacco: Not on file  . Alcohol Use: No   OB History   Grav Para Term Preterm Abortions TAB SAB Ect Mult Living                 Review of Systems A complete 10 system review of systems was obtained and all systems are negative except as noted in the HPI and  PMH.   Allergies  Erythromycin and Penicillins  Home Medications   Prior to Admission medications   Medication Sig Start Date End Date Taking? Authorizing Provider  albuterol (PROVENTIL HFA;VENTOLIN HFA) 108 (90 BASE) MCG/ACT inhaler Inhale 2 puffs into the lungs every 6 (six) hours as needed for wheezing or shortness of breath.   Yes Historical Provider, MD  albuterol (PROVENTIL) (2.5 MG/3ML) 0.083% nebulizer solution Take 2.5 mg by nebulization every 6 (six) hours as needed. For shortness of breath   Yes Historical Provider, MD  ALPRAZolam (XANAX) 1 MG tablet Take 1 tablet (1 mg total) by mouth 3 (three) times daily. 12/28/13  Yes Levonne Spiller, MD  BROVANA 15 MCG/2ML NEBU Take 2 mLs by nebulization daily. 05/01/13  Yes Historical Provider, MD  budesonide-formoterol (SYMBICORT) 160-4.5 MCG/ACT inhaler Inhale 2 puffs into the lungs 2 (two) times daily.   Yes Historical Provider, MD  guaiFENesin (MUCINEX) 600 MG 12 hr tablet Take 600 mg by mouth 2 (two) times daily.   Yes Historical Provider, MD  hydrochlorothiazide (HYDRODIURIL) 25 MG tablet Take 25 mg by mouth daily.   Yes Historical Provider, MD  mirtazapine (REMERON) 15 MG tablet Take 30 mg by mouth at bedtime.   Yes Historical Provider, MD  Multiple Vitamin (MULTIVITAMIN WITH MINERALS) TABS  Take 1 tablet by mouth daily.   Yes Historical Provider, MD  simvastatin (ZOCOR) 40 MG tablet Take 40 mg by mouth at bedtime.    Yes Historical Provider, MD  tiotropium (SPIRIVA) 18 MCG inhalation capsule Place 1 capsule (18 mcg total) into inhaler and inhale daily. 09/04/12  Yes Nimish Luther Parody, MD  triamterene-hydrochlorothiazide (MAXZIDE-25) 37.5-25 MG per tablet Take 1 each (1 tablet total) by mouth daily. 09/16/12  Yes Mirna Mires, MD  venlafaxine (EFFEXOR) 75 MG tablet Take 225 mg by mouth every morning.   Yes Historical Provider, MD   BP 121/79  Pulse 109  Temp(Src) 98 F (36.7 C) (Oral)  Resp 20  Ht 5\' 4"  (1.626 m)  Wt 110 lb (49.896 kg)   BMI 18.87 kg/m2  SpO2 94% Physical Exam  Nursing note and vitals reviewed. Constitutional: She is oriented to person, place, and time. She appears well-developed and well-nourished. No distress.  HENT:  Head: Normocephalic and atraumatic.  Eyes: Conjunctivae and EOM are normal.  Neck: Normal range of motion. No tracheal deviation present.  Cardiovascular: Normal rate, regular rhythm, normal heart sounds and intact distal pulses.   Pulmonary/Chest: Tachypnea noted. No respiratory distress. She has wheezes (bilateral expiratory wheezing). She has no rales.  Musculoskeletal: Normal range of motion. She exhibits no edema.  No LE swelling  Neurological: She is alert and oriented to person, place, and time.  Skin: Skin is warm and dry.  Psychiatric: She has a normal mood and affect. Her behavior is normal.   ED Course  Procedures (including critical care time)  DIAGNOSTIC STUDIES: Oxygen Saturation is 94% on room air, adequate by my interpretation.    COORDINATION OF CARE: 9:31 PM - discussed treatment plan including a breathing treatment and CXR with pt at bedside and patient agreed to plan.   Medications  methylPREDNISolone sodium succinate (SOLU-MEDROL) 125 mg/2 mL injection 125 mg (125 mg Intravenous Given 01/22/14 2208)  sodium chloride 0.9 % bolus 500 mL (500 mLs Intravenous New Bag/Given 01/22/14 2146)  ipratropium-albuterol (DUONEB) 0.5-2.5 (3) MG/3ML nebulizer solution 3 mL (3 mLs Nebulization Given 01/22/14 2200)  albuterol (PROVENTIL) (2.5 MG/3ML) 0.083% nebulizer solution 2.5 mg (2.5 mg Nebulization Given 01/22/14 2200)   Results for orders placed during the hospital encounter of 85/88/50  BASIC METABOLIC PANEL      Result Value Ref Range   Sodium 143  137 - 147 mEq/L   Potassium 3.3 (*) 3.7 - 5.3 mEq/L   Chloride 103  96 - 112 mEq/L   CO2 27  19 - 32 mEq/L   Glucose, Bld 99  70 - 99 mg/dL   BUN 11  6 - 23 mg/dL   Creatinine, Ser 0.78  0.50 - 1.10 mg/dL   Calcium 8.9  8.4 -  10.5 mg/dL   GFR calc non Af Amer 88 (*) >90 mL/min   GFR calc Af Amer >90  >90 mL/min  CBC WITH DIFFERENTIAL      Result Value Ref Range   WBC 9.2  4.0 - 10.5 K/uL   RBC 4.10  3.87 - 5.11 MIL/uL   Hemoglobin 12.1  12.0 - 15.0 g/dL   HCT 36.4  36.0 - 46.0 %   MCV 88.8  78.0 - 100.0 fL   MCH 29.5  26.0 - 34.0 pg   MCHC 33.2  30.0 - 36.0 g/dL   RDW 13.4  11.5 - 15.5 %   Platelets 267  150 - 400 K/uL   Neutrophils Relative % 65  43 - 77 %   Neutro Abs 6.0  1.7 - 7.7 K/uL   Lymphocytes Relative 23  12 - 46 %   Lymphs Abs 2.2  0.7 - 4.0 K/uL   Monocytes Relative 8  3 - 12 %   Monocytes Absolute 0.8  0.1 - 1.0 K/uL   Eosinophils Relative 3  0 - 5 %   Eosinophils Absolute 0.3  0.0 - 0.7 K/uL   Basophils Relative 1  0 - 1 %   Basophils Absolute 0.1  0.0 - 0.1 K/uL   No results found.   EKG Interpretation   Date/Time:  Monday January 22 2014 21:20:04 EDT Ventricular Rate:  92 PR Interval:  130 QRS Duration: 84 QT Interval:  388 QTC Calculation: 479 R Axis:   78 Text Interpretation:  Normal sinus rhythm Possible Left atrial enlargement  Borderline ECG Confirmed by Aadam Zhen  MD, Maisen Schmit ([id number]) on 01/22/2014 9:36:35 PM      MDM   Final diagnoses:  COPD exacerbation    CXR shows no PNA.  Rx albuterol/atrovent/ iv steroids.  Admit  I personally performed the services described in this documentation, which was scribed in my presence. The recorded information has been reviewed and is accurate.    Nat Christen, MD 01/22/14 2308
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
## 2014-01-23 ENCOUNTER — Telehealth (HOSPITAL_COMMUNITY): Payer: Self-pay | Admitting: *Deleted

## 2014-01-23 ENCOUNTER — Ambulatory Visit (HOSPITAL_COMMUNITY): Payer: Self-pay | Admitting: Psychiatry

## 2014-01-23 DIAGNOSIS — F17200 Nicotine dependence, unspecified, uncomplicated: Secondary | ICD-10-CM

## 2014-01-23 DIAGNOSIS — J9620 Acute and chronic respiratory failure, unspecified whether with hypoxia or hypercapnia: Secondary | ICD-10-CM

## 2014-01-23 LAB — CBC
HCT: 37.9 % (ref 36.0–46.0)
Hemoglobin: 12.1 g/dL (ref 12.0–15.0)
MCH: 28.8 pg (ref 26.0–34.0)
MCHC: 31.9 g/dL (ref 30.0–36.0)
MCV: 90.2 fL (ref 78.0–100.0)
Platelets: 299 10*3/uL (ref 150–400)
RBC: 4.2 MIL/uL (ref 3.87–5.11)
RDW: 13.7 % (ref 11.5–15.5)
WBC: 6.8 10*3/uL (ref 4.0–10.5)

## 2014-01-23 LAB — COMPREHENSIVE METABOLIC PANEL
ALT: 14 U/L (ref 0–35)
AST: 14 U/L (ref 0–37)
Albumin: 3 g/dL — ABNORMAL LOW (ref 3.5–5.2)
Alkaline Phosphatase: 105 U/L (ref 39–117)
BILIRUBIN TOTAL: 0.2 mg/dL — AB (ref 0.3–1.2)
BUN: 11 mg/dL (ref 6–23)
CHLORIDE: 107 meq/L (ref 96–112)
CO2: 26 meq/L (ref 19–32)
CREATININE: 0.65 mg/dL (ref 0.50–1.10)
Calcium: 9.2 mg/dL (ref 8.4–10.5)
GFR calc Af Amer: >90 mL/min (ref >90)
Glucose, Bld: 152 mg/dL — ABNORMAL HIGH (ref 70–99)
Potassium: 3.8 mEq/L (ref 3.7–5.3)
Sodium: 146 mEq/L (ref 137–147)
Total Protein: 7.3 g/dL (ref 6.0–8.3)

## 2014-01-23 MED ORDER — IPRATROPIUM-ALBUTEROL 0.5-2.5 (3) MG/3ML IN SOLN
3.0000 mL | Freq: Four times a day (QID) | RESPIRATORY_TRACT | Status: DC
  Administered 2014-01-23 (×2): 3 mL via RESPIRATORY_TRACT
  Filled 2014-01-23 (×3): qty 3

## 2014-01-23 MED ORDER — SODIUM CHLORIDE 0.9 % IJ SOLN
3.0000 mL | INTRAMUSCULAR | Status: DC | PRN
Start: 2014-01-23 — End: 2014-01-23

## 2014-01-23 MED ORDER — ALBUTEROL SULFATE (2.5 MG/3ML) 0.083% IN NEBU: 2.5000 mg | INHALATION_SOLUTION | RESPIRATORY_TRACT | Status: DC | PRN

## 2014-01-23 MED ORDER — NICOTINE 7 MG/24HR TD PT24
7.0000 mg | MEDICATED_PATCH | Freq: Every day | TRANSDERMAL | Status: DC
  Administered 2014-01-23: 7 mg via TRANSDERMAL
  Filled 2014-01-23 (×2): qty 1

## 2014-01-23 MED ORDER — LEVOFLOXACIN IN D5W 500 MG/100ML IV SOLN
INTRAVENOUS | Status: AC
  Filled 2014-01-23: qty 100

## 2014-01-23 MED ORDER — GUAIFENESIN ER 600 MG PO TB12
600.0000 mg | ORAL_TABLET | Freq: Two times a day (BID) | ORAL | Status: DC
  Administered 2014-01-23 (×2): 600 mg via ORAL
  Filled 2014-01-23 (×2): qty 1

## 2014-01-23 MED ORDER — VENLAFAXINE HCL ER 75 MG PO CP24
225.0000 mg | ORAL_CAPSULE | Freq: Every morning | ORAL | Status: DC
  Administered 2014-01-23: 225 mg via ORAL
  Filled 2014-01-23: qty 3

## 2014-01-23 MED ORDER — ALBUTEROL SULFATE (2.5 MG/3ML) 0.083% IN NEBU: 2.5000 mg | INHALATION_SOLUTION | Freq: Four times a day (QID) | RESPIRATORY_TRACT | Status: DC

## 2014-01-23 MED ORDER — ACETAMINOPHEN 325 MG PO TABS: 650.0000 mg | ORAL_TABLET | Freq: Four times a day (QID) | ORAL | Status: DC | PRN

## 2014-01-23 MED ORDER — LEVOFLOXACIN IN D5W 500 MG/100ML IV SOLN
500.0000 mg | Freq: Every day | INTRAVENOUS | Status: DC
  Administered 2014-01-23: 500 mg via INTRAVENOUS
  Filled 2014-01-23 (×2): qty 100

## 2014-01-23 MED ORDER — SODIUM CHLORIDE 0.9 % IJ SOLN
3.0000 mL | Freq: Two times a day (BID) | INTRAMUSCULAR | Status: DC
  Administered 2014-01-23 (×2): 3 mL via INTRAVENOUS

## 2014-01-23 MED ORDER — SIMVASTATIN 20 MG PO TABS
40.0000 mg | ORAL_TABLET | Freq: Every day | ORAL | Status: DC
  Administered 2014-01-23: 40 mg via ORAL
  Filled 2014-01-23: qty 2

## 2014-01-23 MED ORDER — NICOTINE 7 MG/24HR TD PT24: 7.0000 mg | MEDICATED_PATCH | Freq: Every day | TRANSDERMAL | Status: DC

## 2014-01-23 MED ORDER — METHYLPREDNISOLONE SODIUM SUCC 125 MG IJ SOLR
60.0000 mg | Freq: Four times a day (QID) | INTRAMUSCULAR | Status: DC
  Administered 2014-01-23 (×3): 60 mg via INTRAVENOUS
  Filled 2014-01-23 (×3): qty 2

## 2014-01-23 MED ORDER — ONDANSETRON HCL 4 MG/2ML IJ SOLN: 4.0000 mg | Freq: Four times a day (QID) | INTRAMUSCULAR | Status: DC | PRN

## 2014-01-23 MED ORDER — LEVOFLOXACIN 500 MG PO TABS: 500.0000 mg | ORAL_TABLET | Freq: Every day | ORAL | Status: DC

## 2014-01-23 MED ORDER — ACETAMINOPHEN 650 MG RE SUPP: 650.0000 mg | Freq: Four times a day (QID) | RECTAL | Status: DC | PRN

## 2014-01-23 MED ORDER — MIRTAZAPINE 30 MG PO TABS
30.0000 mg | ORAL_TABLET | Freq: Every day | ORAL | Status: DC
  Administered 2014-01-23: 30 mg via ORAL
  Filled 2014-01-23: qty 1

## 2014-01-23 MED ORDER — PREDNISONE 10 MG PO TABS: ORAL_TABLET | ORAL | Status: DC

## 2014-01-23 MED ORDER — IPRATROPIUM BROMIDE 0.02 % IN SOLN: 0.5000 mg | Freq: Four times a day (QID) | RESPIRATORY_TRACT | Status: DC

## 2014-01-23 MED ORDER — SODIUM CHLORIDE 0.9 % IV SOLN: 250.0000 mL | INTRAVENOUS | Status: DC | PRN

## 2014-01-23 MED ORDER — ALPRAZOLAM 1 MG PO TABS
1.0000 mg | ORAL_TABLET | Freq: Three times a day (TID) | ORAL | Status: DC
  Administered 2014-01-23 (×3): 1 mg via ORAL
  Filled 2014-01-23 (×3): qty 1

## 2014-01-23 MED ORDER — ONDANSETRON HCL 4 MG PO TABS: 4.0000 mg | ORAL_TABLET | Freq: Four times a day (QID) | ORAL | Status: DC | PRN

## 2014-01-23 MED ORDER — ENOXAPARIN SODIUM 40 MG/0.4ML ~~LOC~~ SOLN
40.0000 mg | SUBCUTANEOUS | Status: DC
  Administered 2014-01-23: 40 mg via SUBCUTANEOUS
  Filled 2014-01-23: qty 0.4
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
## 2014-01-23 NOTE — Telephone Encounter (Signed)
noted 

## 2014-01-23 NOTE — Care Management Utilization Note (Signed)
UR completed 

## 2014-01-23 NOTE — Discharge Instructions (Signed)
Chronic Obstructive Pulmonary Disease  Chronic obstructive pulmonary disease (COPD) is a common lung condition in which airflow from the lungs is limited. COPD is a general term that can be used to describe many different lung problems that limit airflow, including both chronic bronchitis and emphysema.  If you have COPD, your lung function will probably never return to normal, but there are measures you can take to improve lung function and make yourself feel better.   CAUSES   · Smoking (common).    · Exposure to secondhand smoke.    · Genetic problems.  · Chronic inflammatory lung diseases or recurrent infections.  SYMPTOMS   · Shortness of breath, especially with physical activity.    · Deep, persistent (chronic) cough with a large amount of thick mucus.    · Wheezing.    · Rapid breaths (tachypnea).    · Gray or bluish discoloration (cyanosis) of the skin, especially in fingers, toes, or lips.    · Fatigue.    · Weight loss.    · Frequent infections or episodes when breathing symptoms become much worse (exacerbations).    · Chest tightness.  DIAGNOSIS   Your healthcare provider will take a medical history and perform a physical examination to make the initial diagnosis.  Additional tests for COPD may include:   · Lung (pulmonary) function tests.  · Chest X-ray.  · CT scan.  · Blood tests.  TREATMENT   Treatment available to help you feel better when you have COPD include:   · Inhaler and nebulizer medicines. These help manage the symptoms of COPD and make your breathing more comfortable  · Supplemental oxygen. Supplemental oxygen is only helpful if you have a low oxygen level in your blood.    · Exercise and physical activity. These are beneficial for nearly all people with COPD. Some people may also benefit from a pulmonary rehabilitation program.  HOME CARE INSTRUCTIONS   · Take all medicines (inhaled or pills) as directed by your health care provider.  · Only take over-the-counter or prescription medicines  for pain, fever, or discomfort as directed by your health care provider.    · Avoid over-the-counter medicines or cough syrups that dry up your airway (such as antihistamines) and slow down the elimination of secretions unless instructed otherwise by your healthcare provider.    · If you are a smoker, the most important thing that you can do is stop smoking. Continuing to smoke will cause further lung damage and breathing trouble. Ask your health care provider for help with quitting smoking. He or she can direct you to community resources or hospitals that provide support.  · Avoid exposure to irritants such as smoke, chemicals, and fumes that aggravate your breathing.  · Use oxygen therapy and pulmonary rehabilitation if directed by your health care provider. If you require home oxygen therapy, ask your healthcare provider whether you should purchase a pulse oximeter to measure your oxygen level at home.    · Avoid contact with individuals who have a contagious illness.  · Avoid extreme temperature and humidity changes.  · Eat healthy foods. Eating smaller, more frequent meals and resting before meals may help you maintain your strength.  · Stay active, but balance activity with periods of rest. Exercise and physical activity will help you maintain your ability to do things you want to do.  · Preventing infection and hospitalization is very important when you have COPD. Make sure to receive all the vaccines your health care provider recommends, especially the pneumococcal and influenza vaccines. Ask your healthcare provider whether you   need a pneumonia vaccine.  · Learn and use relaxation techniques to manage stress.  · Learn and use controlled breathing techniques as directed by your health care provider. Controlled breathing techniques include:    · Pursed lip breathing. Start by breathing in (inhaling) through your nose for 1 second. Then, purse your lips as if you were going to whistle and breathe out (exhale)  through the pursed lips for 2 seconds.    · Diaphragmatic breathing. Start by putting one hand on your abdomen just above your waist. Inhale slowly through your nose. The hand on your abdomen should move out. Then purse your lips and exhale slowly. You should be able to feel the hand on your abdomen moving in as you exhale.    · Learn and use controlled coughing to clear mucus from your lungs. Controlled coughing is a series of short, progressive coughs. The steps of controlled coughing are:    1. Lean your head slightly forward.    2. Breathe in deeply using diaphragmatic breathing.    3. Try to hold your breath for 3 seconds.    4. Keep your mouth slightly open while coughing twice.    5. Spit any mucus out into a tissue.    6. Rest and repeat the steps once or twice as needed.  SEEK MEDICAL CARE IF:   · You are coughing up more mucus than usual.    · There is a change in the color or thickness of your mucus.    · Your breathing is more labored than usual.    · Your breathing is faster than usual.    SEEK IMMEDIATE MEDICAL CARE IF:   · You have shortness of breath while you are resting.    · You have shortness of breath that prevents you from:  · Being able to talk.    · Performing your usual physical activities.    · You have chest pain lasting longer than 5 minutes.    · Your skin color is more cyanotic than usual.  · You measure low oxygen saturations for longer than 5 minutes with a pulse oximeter.  MAKE SURE YOU:   · Understand these instructions.  · Will watch your condition.  · Will get help right away if you are not doing well or get worse.  Document Released: 05/13/2005 Document Revised: 05/24/2013 Document Reviewed: 03/30/2013  [employer]® Patient Information ©2014 [employer].

## 2014-01-25 NOTE — Progress Notes (Signed)
Ambulated in the hall without oxygen without assistance from staff, patient reported "I feel fine, no SOB", oxygen saturations on room air 94%, reported findings to Dr. Roderic Palau, orders for discharge home.

## 2014-01-25 NOTE — Progress Notes (Signed)
Discharge instructions and prescriptions given, verbalized understanding, out in stable condition with staff via w/c. 

## 2014-02-03 ENCOUNTER — Emergency Department (HOSPITAL_COMMUNITY)
Admission: EM | Admit: 2014-02-03 | Discharge: 2014-02-03 | Disposition: A | Discharge status: Home / Self Care | Payer: Medicare Other | Attending: Emergency Medicine | Admitting: Emergency Medicine

## 2014-02-03 ENCOUNTER — Emergency Department (HOSPITAL_COMMUNITY): Payer: Medicare Other

## 2014-02-03 ENCOUNTER — Emergency Department (HOSPITAL_COMMUNITY): Payer: Self-pay

## 2014-02-03 ENCOUNTER — Encounter (HOSPITAL_COMMUNITY): Payer: Self-pay | Admitting: Emergency Medicine

## 2014-02-03 DIAGNOSIS — S0003XA Contusion of scalp, initial encounter: Secondary | ICD-10-CM | POA: Insufficient documentation

## 2014-02-03 DIAGNOSIS — F411 Generalized anxiety disorder: Secondary | ICD-10-CM | POA: Insufficient documentation

## 2014-02-03 DIAGNOSIS — S298XXA Other specified injuries of thorax, initial encounter: Secondary | ICD-10-CM | POA: Insufficient documentation

## 2014-02-03 DIAGNOSIS — E785 Hyperlipidemia, unspecified: Secondary | ICD-10-CM | POA: Insufficient documentation

## 2014-02-03 DIAGNOSIS — Z79899 Other long term (current) drug therapy: Secondary | ICD-10-CM | POA: Diagnosis not present

## 2014-02-03 DIAGNOSIS — S199XXA Unspecified injury of neck, initial encounter: Secondary | ICD-10-CM | POA: Diagnosis present

## 2014-02-03 DIAGNOSIS — F17200 Nicotine dependence, unspecified, uncomplicated: Secondary | ICD-10-CM | POA: Diagnosis not present

## 2014-02-03 DIAGNOSIS — J449 Chronic obstructive pulmonary disease, unspecified: Secondary | ICD-10-CM | POA: Diagnosis not present

## 2014-02-03 DIAGNOSIS — I10 Essential (primary) hypertension: Secondary | ICD-10-CM | POA: Diagnosis not present

## 2014-02-03 DIAGNOSIS — S0993XA Unspecified injury of face, initial encounter: Secondary | ICD-10-CM | POA: Diagnosis present

## 2014-02-03 DIAGNOSIS — S0083XA Contusion of other part of head, initial encounter: Secondary | ICD-10-CM

## 2014-02-03 DIAGNOSIS — IMO0002 Reserved for concepts with insufficient information to code with codable children: Secondary | ICD-10-CM | POA: Diagnosis not present

## 2014-02-03 DIAGNOSIS — Z859 Personal history of malignant neoplasm, unspecified: Secondary | ICD-10-CM | POA: Insufficient documentation

## 2014-02-03 DIAGNOSIS — Z880 Allergy status to penicillin: Secondary | ICD-10-CM | POA: Insufficient documentation

## 2014-02-03 DIAGNOSIS — IMO0001 Reserved for inherently not codable concepts without codable children: Secondary | ICD-10-CM | POA: Insufficient documentation

## 2014-02-03 DIAGNOSIS — R269 Unspecified abnormalities of gait and mobility: Secondary | ICD-10-CM | POA: Insufficient documentation

## 2014-02-03 DIAGNOSIS — S022XXA Fracture of nasal bones, initial encounter for closed fracture: Secondary | ICD-10-CM | POA: Diagnosis not present

## 2014-02-03 DIAGNOSIS — J4489 Other specified chronic obstructive pulmonary disease: Secondary | ICD-10-CM | POA: Insufficient documentation

## 2014-02-03 DIAGNOSIS — S1093XA Contusion of unspecified part of neck, initial encounter: Secondary | ICD-10-CM

## 2014-02-03 MED ORDER — HYDROCODONE-ACETAMINOPHEN 5-325 MG PO TABS: 1.0000 | ORAL_TABLET | ORAL | Status: DC | PRN

## 2014-02-03 MED ORDER — OXYCODONE-ACETAMINOPHEN 5-325 MG PO TABS
ORAL_TABLET | ORAL | Status: AC
  Filled 2014-02-03: qty 1

## 2014-02-03 MED ORDER — HYDROCODONE-ACETAMINOPHEN 5-325 MG PO TABS
2.0000 | ORAL_TABLET | Freq: Once | ORAL | Status: AC
  Administered 2014-02-03: 2 via ORAL
  Filled 2014-02-03: qty 2

## 2014-02-03 MED ORDER — OXYCODONE-ACETAMINOPHEN 5-325 MG PO TABS
1.0000 | ORAL_TABLET | Freq: Once | ORAL | Status: AC
  Administered 2014-02-03: 1 via ORAL
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
## 2014-02-05 ENCOUNTER — Telehealth (HOSPITAL_COMMUNITY): Payer: Self-pay | Admitting: *Deleted

## 2014-02-05 ENCOUNTER — Other Ambulatory Visit (HOSPITAL_COMMUNITY): Payer: Self-pay | Admitting: Psychiatry

## 2014-02-05 ENCOUNTER — Ambulatory Visit (HOSPITAL_COMMUNITY): Payer: Self-pay | Admitting: Psychiatry

## 2014-02-05 NOTE — Telephone Encounter (Signed)
Patient getting xanax from multiple pharmacies. All prescriptions cancelled

## 2014-02-08 ENCOUNTER — Telehealth (HOSPITAL_COMMUNITY): Payer: Self-pay | Admitting: *Deleted

## 2014-02-19 ENCOUNTER — Encounter (HOSPITAL_COMMUNITY): Payer: Self-pay | Admitting: Psychiatry

## 2014-02-19 ENCOUNTER — Telehealth (HOSPITAL_COMMUNITY): Payer: Self-pay | Admitting: *Deleted

## 2014-02-19 ENCOUNTER — Ambulatory Visit (INDEPENDENT_AMBULATORY_CARE_PROVIDER_SITE_OTHER): Payer: 59 | Admitting: Psychiatry

## 2014-02-19 VITALS — BP 140/94 | Ht 64.0 in | Wt 109.0 lb

## 2014-02-19 DIAGNOSIS — F411 Generalized anxiety disorder: Secondary | ICD-10-CM

## 2014-02-19 DIAGNOSIS — F332 Major depressive disorder, recurrent severe without psychotic features: Secondary | ICD-10-CM

## 2014-02-19 MED ORDER — MIRTAZAPINE 15 MG PO TABS: 30.0000 mg | ORAL_TABLET | Freq: Every day | ORAL | Status: DC

## 2014-02-19 MED ORDER — QUETIAPINE FUMARATE 25 MG PO TABS: 25.0000 mg | ORAL_TABLET | Freq: Three times a day (TID) | ORAL | Status: DC

## 2014-02-19 MED ORDER — VENLAFAXINE HCL 75 MG PO TABS: 225.0000 mg | ORAL_TABLET | Freq: Every morning | ORAL | Status: DC

## 2014-02-19 NOTE — Telephone Encounter (Signed)
Only 30 days sent
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
## 2014-02-28 ENCOUNTER — Telehealth (HOSPITAL_COMMUNITY): Payer: Self-pay | Admitting: *Deleted

## 2014-02-28 NOTE — Telephone Encounter (Signed)
No change needed

## 2014-03-20 ENCOUNTER — Ambulatory Visit (HOSPITAL_COMMUNITY): Payer: Self-pay | Admitting: Psychiatry

## 2014-03-20 ENCOUNTER — Encounter (HOSPITAL_COMMUNITY): Payer: Self-pay | Admitting: Psychiatry

## 2014-04-18 ENCOUNTER — Encounter (HOSPITAL_COMMUNITY): Payer: Self-pay | Admitting: Emergency Medicine

## 2014-04-18 ENCOUNTER — Emergency Department (HOSPITAL_COMMUNITY): Payer: 59

## 2014-04-18 ENCOUNTER — Emergency Department (HOSPITAL_COMMUNITY)
Admission: EM | Admit: 2014-04-18 | Discharge: 2014-04-18 | Disposition: A | Discharge status: Home / Self Care | Payer: 59 | Attending: Emergency Medicine | Admitting: Emergency Medicine

## 2014-04-18 DIAGNOSIS — F17200 Nicotine dependence, unspecified, uncomplicated: Secondary | ICD-10-CM | POA: Diagnosis not present

## 2014-04-18 DIAGNOSIS — S298XXA Other specified injuries of thorax, initial encounter: Secondary | ICD-10-CM | POA: Insufficient documentation

## 2014-04-18 DIAGNOSIS — W06XXXA Fall from bed, initial encounter: Secondary | ICD-10-CM | POA: Diagnosis not present

## 2014-04-18 DIAGNOSIS — Z853 Personal history of malignant neoplasm of breast: Secondary | ICD-10-CM | POA: Diagnosis not present

## 2014-04-18 DIAGNOSIS — Z79899 Other long term (current) drug therapy: Secondary | ICD-10-CM | POA: Diagnosis not present

## 2014-04-18 DIAGNOSIS — F411 Generalized anxiety disorder: Secondary | ICD-10-CM | POA: Insufficient documentation

## 2014-04-18 DIAGNOSIS — S20219A Contusion of unspecified front wall of thorax, initial encounter: Secondary | ICD-10-CM | POA: Insufficient documentation

## 2014-04-18 DIAGNOSIS — S3981XA Other specified injuries of abdomen, initial encounter: Secondary | ICD-10-CM | POA: Diagnosis not present

## 2014-04-18 DIAGNOSIS — E785 Hyperlipidemia, unspecified: Secondary | ICD-10-CM | POA: Diagnosis not present

## 2014-04-18 DIAGNOSIS — Y9289 Other specified places as the place of occurrence of the external cause: Secondary | ICD-10-CM | POA: Diagnosis not present

## 2014-04-18 DIAGNOSIS — Z880 Allergy status to penicillin: Secondary | ICD-10-CM | POA: Insufficient documentation

## 2014-04-18 DIAGNOSIS — I10 Essential (primary) hypertension: Secondary | ICD-10-CM | POA: Insufficient documentation

## 2014-04-18 DIAGNOSIS — J4489 Other specified chronic obstructive pulmonary disease: Secondary | ICD-10-CM | POA: Insufficient documentation

## 2014-04-18 DIAGNOSIS — J449 Chronic obstructive pulmonary disease, unspecified: Secondary | ICD-10-CM | POA: Diagnosis not present

## 2014-04-18 DIAGNOSIS — Y9389 Activity, other specified: Secondary | ICD-10-CM | POA: Diagnosis not present

## 2014-04-18 DIAGNOSIS — S20212A Contusion of left front wall of thorax, initial encounter: Secondary | ICD-10-CM

## 2014-04-18 LAB — CBC WITH DIFFERENTIAL/PLATELET
BASOS PCT: 2 % — AB (ref 0–1)
Basophils Absolute: 0.2 10*3/uL — ABNORMAL HIGH (ref 0.0–0.1)
EOS PCT: 1 % (ref 0–5)
Eosinophils Absolute: 0.1 10*3/uL (ref 0.0–0.7)
HCT: 42.4 % (ref 36.0–46.0)
Hemoglobin: 13.8 g/dL (ref 12.0–15.0)
Lymphocytes Relative: 21 % (ref 12–46)
Lymphs Abs: 2.1 10*3/uL (ref 0.7–4.0)
MCH: 27.9 pg (ref 26.0–34.0)
MCHC: 32.5 g/dL (ref 30.0–36.0)
MCV: 85.8 fL (ref 78.0–100.0)
Monocytes Absolute: 0.6 10*3/uL (ref 0.1–1.0)
Monocytes Relative: 6 % (ref 3–12)
NEUTROS PCT: 70 % (ref 43–77)
Neutro Abs: 7 10*3/uL (ref 1.7–7.7)
PLATELETS: 321 10*3/uL (ref 150–400)
RBC: 4.94 MIL/uL (ref 3.87–5.11)
RDW: 15.2 % (ref 11.5–15.5)
WBC: 10 10*3/uL (ref 4.0–10.5)

## 2014-04-18 LAB — URINALYSIS, ROUTINE W REFLEX MICROSCOPIC
BILIRUBIN URINE: NEGATIVE
Glucose, UA: NEGATIVE mg/dL
Hgb urine dipstick: NEGATIVE
Ketones, ur: NEGATIVE mg/dL
LEUKOCYTES UA: NEGATIVE
Nitrite: NEGATIVE
PH: 5.5 (ref 5.0–8.0)
Protein, ur: NEGATIVE mg/dL
UROBILINOGEN UA: 0.2 mg/dL (ref 0.0–1.0)

## 2014-04-18 LAB — COMPREHENSIVE METABOLIC PANEL
ALBUMIN: 3.6 g/dL (ref 3.5–5.2)
ALK PHOS: 107 U/L (ref 39–117)
ALT: 33 U/L (ref 0–35)
AST: 24 U/L (ref 0–37)
Anion gap: 14 (ref 5–15)
BUN: 18 mg/dL (ref 6–23)
CALCIUM: 9.5 mg/dL (ref 8.4–10.5)
CO2: 29 mEq/L (ref 19–32)
Chloride: 104 mEq/L (ref 96–112)
Creatinine, Ser: 0.69 mg/dL (ref 0.50–1.10)
GFR calc Af Amer: >90 mL/min (ref >90)
GFR calc non Af Amer: >90 mL/min (ref >90)
Glucose, Bld: 114 mg/dL — ABNORMAL HIGH (ref 70–99)
POTASSIUM: 3.2 meq/L — AB (ref 3.7–5.3)
SODIUM: 147 meq/L (ref 137–147)
Total Bilirubin: 0.4 mg/dL (ref 0.3–1.2)
Total Protein: 7.8 g/dL (ref 6.0–8.3)

## 2014-04-18 MED ORDER — HYDROMORPHONE HCL PF 1 MG/ML IJ SOLN
0.5000 mg | Freq: Once | INTRAMUSCULAR | Status: AC
  Administered 2014-04-18: 0.5 mg via INTRAVENOUS
  Filled 2014-04-18: qty 1

## 2014-04-18 MED ORDER — ONDANSETRON HCL 4 MG/2ML IJ SOLN
4.0000 mg | Freq: Once | INTRAMUSCULAR | Status: AC
  Administered 2014-04-18: 4 mg via INTRAVENOUS
  Filled 2014-04-18: qty 2

## 2014-04-18 MED ORDER — HYDROCODONE-ACETAMINOPHEN 5-325 MG PO TABS: 1.0000 | ORAL_TABLET | Freq: Four times a day (QID) | ORAL | Status: DC | PRN

## 2014-04-18 MED ORDER — IOHEXOL 300 MG/ML  SOLN
100.0000 mL | Freq: Once | INTRAMUSCULAR | Status: AC | PRN
  Administered 2014-04-18: 100 mL via INTRAVENOUS

## 2014-04-18 MED ORDER — POTASSIUM CHLORIDE CRYS ER 20 MEQ PO TBCR
40.0000 meq | EXTENDED_RELEASE_TABLET | Freq: Once | ORAL | Status: AC
  Administered 2014-04-18: 40 meq via ORAL
  Filled 2014-04-18: qty 2

## 2014-04-18 NOTE — Discharge Instructions (Signed)
Follow up with your md if needed °

## 2014-04-18 NOTE — ED Notes (Signed)
Pain to left rib cage after falling out of bed last night.  Reports pain with deep breath.  Reports hitting head, denies LOC.

## 2014-04-18 NOTE — ED Provider Notes (Signed)
CSN: [id number]     Arrival date & time 04/18/14  1148 History   First MD Initiated Contact with Patient 04/18/14 1208    This chart was scribed for Maudry Diego, MD by Edison Simon, ED Scribe. This patient was seen in room APA03/APA03 and the patient's care was started at 12:09 PM.     Chief Complaint  Patient presents with  . Chest Pain   Patient is a 63 y.o. female presenting with chest pain. The history is provided by the patient. No language interpreter was used.  Chest Pain Chest pain location: left rib area. Pain radiates to:  Does not radiate Pain radiates to the back: no   Pain severity:  Moderate Onset quality:  Sudden Timing:  Constant Chronicity:  New Context: trauma (fall from bed)   Relieved by:  Nothing Worsened by:  Deep breathing Ineffective treatments:  None tried Associated symptoms: no abdominal pain, no back pain, no cough, no fatigue and no headache     HPI Comments: Allison Park is a 63 y.o. female who presents to the Emergency Department complaining of pain to left rib area after falling out of bed last night. She reports pain is worse with breathing. She reports a history of COPD. She denies numbness, tingling, or LOC.   Past Medical History  Diagnosis Date  . Hypertension   . Hyperlipemia   . COPD (chronic obstructive pulmonary disease)   . Anxiety   . Fibromyalgia   . Cancer    Past Surgical History  Procedure Laterality Date  . Mastectomy    . Abdominal hysterectomy    . Back surgery    . Breast surgery    . Breast surgery    . Tonsillectomy     Family History  Problem Relation Age of Onset  . Depression Maternal Aunt    History  Substance Use Topics  . Smoking status: Current Every Day Smoker -- 0.50 packs/day    Types: Cigarettes  . Smokeless tobacco: Not on file  . Alcohol Use: No   OB History   Grav Para Term Preterm Abortions TAB SAB Ect Mult Living                 Review of Systems  Constitutional: Negative for  appetite change and fatigue.  HENT: Negative for congestion, ear discharge and sinus pressure.   Eyes: Negative for discharge.  Respiratory: Negative for cough.   Cardiovascular: Positive for chest pain (left lower rib tenderness).  Gastrointestinal: Negative for abdominal pain and diarrhea.  Genitourinary: Negative for frequency and hematuria.  Musculoskeletal: Negative for back pain.  Skin: Negative for rash.  Neurological: Negative for seizures and headaches.  Psychiatric/Behavioral: Negative for hallucinations.      Allergies  Erythromycin and Penicillins  Home Medications   Prior to Admission medications   Medication Sig Start Date End Date Taking? Authorizing Provider  albuterol (PROVENTIL HFA;VENTOLIN HFA) 108 (90 BASE) MCG/ACT inhaler Inhale 2 puffs into the lungs every 6 (six) hours as needed for wheezing or shortness of breath.    Historical Provider, MD  albuterol (PROVENTIL) (2.5 MG/3ML) 0.083% nebulizer solution Take 2.5 mg by nebulization every 6 (six) hours as needed. For shortness of breath    Historical Provider, MD  BROVANA 15 MCG/2ML NEBU Take 2 mLs by nebulization at bedtime.  05/01/13   Historical Provider, MD  guaiFENesin (MUCINEX) 600 MG 12 hr tablet Take 600 mg by mouth 2 (two) times daily.    Historical Provider,  MD  HYDROcodone-acetaminophen (NORCO) 5-325 MG per tablet Take 1-2 tablets by mouth every 4 (four) hours as needed. 02/03/14   Nat Christen, MD  mirtazapine (REMERON) 15 MG tablet Take 2 tablets (30 mg total) by mouth at bedtime. 02/19/14   Levonne Spiller, MD  Multiple Vitamin (MULTIVITAMIN WITH MINERALS) TABS Take 1 tablet by mouth daily.    Historical Provider, MD  nicotine (NICODERM CQ - DOSED IN MG/24 HR) 7 mg/24hr patch Place 1 patch (7 mg total) onto the skin daily. 01/23/14   Kathie Dike, MD  QUEtiapine (SEROQUEL) 25 MG tablet Take 1 tablet (25 mg total) by mouth 3 (three) times daily. 02/19/14 02/19/15  Levonne Spiller, MD  simvastatin (ZOCOR) 40 MG tablet  Take 40 mg by mouth at bedtime.     Historical Provider, MD  tiotropium (SPIRIVA) 18 MCG inhalation capsule Place 1 capsule (18 mcg total) into inhaler and inhale daily. 09/04/12   Nimish Luther Parody, MD  triamterene-hydrochlorothiazide (MAXZIDE-25) 37.5-25 MG per tablet Take 1 each (1 tablet total) by mouth daily. 09/16/12   Mirna Mires, MD  venlafaxine (EFFEXOR) 75 MG tablet Take 3 tablets (225 mg total) by mouth every morning. 02/19/14   Levonne Spiller, MD   BP 137/91  Pulse 135  Temp(Src) 99.1 F (37.3 C) (Oral)  Resp 18  Ht 5\' 4"  (1.626 m)  Wt 110 lb (49.896 kg)  BMI 18.87 kg/m2  SpO2 95% Physical Exam  Constitutional: She is oriented to person, place, and time. She appears well-developed.  HENT:  Head: Normocephalic.  Eyes: Conjunctivae and EOM are normal. No scleral icterus.  Neck: Neck supple. No thyromegaly present.  Cardiovascular: Normal rate and regular rhythm.  Exam reveals no gallop and no friction rub.   No murmur heard. Pulmonary/Chest: No stridor. She has no wheezes. She has no rales. She exhibits tenderness (left lower rib tenderness).  Abdominal: She exhibits no distension. There is tenderness (moderate LUQ tenderness). There is no rebound.  Musculoskeletal: Normal range of motion. She exhibits no edema.  Lymphadenopathy:    She has no cervical adenopathy.  Neurological: She is oriented to person, place, and time. She exhibits normal muscle tone. Coordination normal.  Skin: No rash noted. No erythema.  Psychiatric: She has a normal mood and affect. Her behavior is normal.    ED Course  Procedures (including critical care time) Labs Review Labs Reviewed - No data to display  Imaging Review No results found.   EKG Interpretation None     DIAGNOSTIC STUDIES: Oxygen Saturation is 95% on room air, adequate by my interpretation.    COORDINATION OF CARE:    MDM   Final diagnoses:  None    The chart was scribed for me under my direct supervision.  I  personally performed the history, physical, and medical decision making and all procedures in the evaluation of this patient.Maudry Diego, MD 04/18/14 [phone]

## 2014-04-18 NOTE — ED Notes (Signed)
Patient given discharge instruction, verbalized understand. IV removed, band aid applied. Patient ambulatory out of the department.  

## 2014-06-14 ENCOUNTER — Ambulatory Visit (HOSPITAL_COMMUNITY): Payer: Self-pay | Admitting: Psychiatry

## 2014-06-29 ENCOUNTER — Ambulatory Visit (INDEPENDENT_AMBULATORY_CARE_PROVIDER_SITE_OTHER): Payer: 59 | Admitting: Psychiatry

## 2014-06-29 ENCOUNTER — Encounter (HOSPITAL_COMMUNITY): Payer: Self-pay | Admitting: Psychiatry

## 2014-06-29 VITALS — BP 138/94 | HR 96 | Ht 64.0 in | Wt 106.4 lb

## 2014-06-29 DIAGNOSIS — F411 Generalized anxiety disorder: Secondary | ICD-10-CM

## 2014-06-29 DIAGNOSIS — F332 Major depressive disorder, recurrent severe without psychotic features: Secondary | ICD-10-CM

## 2014-06-29 MED ORDER — OLANZAPINE 10 MG PO TABS: 10.0000 mg | ORAL_TABLET | Freq: Every day | ORAL | Status: DC

## 2014-06-29 MED ORDER — VENLAFAXINE HCL ER 150 MG PO CP24: 150.0000 mg | ORAL_CAPSULE | Freq: Every day | ORAL | Status: AC

## 2014-06-29 MED ORDER — OLANZAPINE 2.5 MG PO TABS: 2.5000 mg | ORAL_TABLET | Freq: Three times a day (TID) | ORAL | Status: DC

## 2014-06-29 MED ORDER — DULOXETINE HCL 60 MG PO CPEP: 60.0000 mg | ORAL_CAPSULE | Freq: Every day | ORAL | Status: DC
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
## 2014-07-27 ENCOUNTER — Encounter (HOSPITAL_COMMUNITY): Payer: Self-pay | Admitting: Psychiatry

## 2014-07-27 ENCOUNTER — Ambulatory Visit (HOSPITAL_COMMUNITY): Payer: Self-pay | Admitting: Psychiatry

## 2014-08-06 ENCOUNTER — Encounter: Payer: Self-pay | Admitting: Gastroenterology

## 2014-10-24 ENCOUNTER — Other Ambulatory Visit (HOSPITAL_COMMUNITY): Payer: Self-pay | Admitting: Psychiatry
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
## 2016-02-14 ENCOUNTER — Encounter (HOSPITAL_COMMUNITY): Payer: Self-pay | Admitting: Emergency Medicine

## 2016-02-14 ENCOUNTER — Emergency Department (HOSPITAL_COMMUNITY): Payer: Medicare Other

## 2016-02-14 ENCOUNTER — Inpatient Hospital Stay (HOSPITAL_COMMUNITY)
Admission: EM | Admit: 2016-02-14 | Discharge: 2016-02-15 | DRG: 190 | Disposition: A | Discharge status: Home / Self Care | Payer: Medicare Other | Attending: Internal Medicine | Admitting: Internal Medicine

## 2016-02-14 DIAGNOSIS — Z881 Allergy status to other antibiotic agents status: Secondary | ICD-10-CM

## 2016-02-14 DIAGNOSIS — F419 Anxiety disorder, unspecified: Secondary | ICD-10-CM | POA: Diagnosis not present

## 2016-02-14 DIAGNOSIS — J9621 Acute and chronic respiratory failure with hypoxia: Secondary | ICD-10-CM | POA: Diagnosis not present

## 2016-02-14 DIAGNOSIS — F17210 Nicotine dependence, cigarettes, uncomplicated: Secondary | ICD-10-CM | POA: Diagnosis not present

## 2016-02-14 DIAGNOSIS — M797 Fibromyalgia: Secondary | ICD-10-CM | POA: Diagnosis present

## 2016-02-14 DIAGNOSIS — I10 Essential (primary) hypertension: Secondary | ICD-10-CM | POA: Diagnosis not present

## 2016-02-14 DIAGNOSIS — E876 Hypokalemia: Secondary | ICD-10-CM | POA: Diagnosis present

## 2016-02-14 DIAGNOSIS — E785 Hyperlipidemia, unspecified: Secondary | ICD-10-CM | POA: Diagnosis not present

## 2016-02-14 DIAGNOSIS — J9620 Acute and chronic respiratory failure, unspecified whether with hypoxia or hypercapnia: Secondary | ICD-10-CM

## 2016-02-14 DIAGNOSIS — Z880 Allergy status to penicillin: Secondary | ICD-10-CM | POA: Diagnosis not present

## 2016-02-14 DIAGNOSIS — J8410 Pulmonary fibrosis, unspecified: Secondary | ICD-10-CM | POA: Diagnosis not present

## 2016-02-14 DIAGNOSIS — Z79899 Other long term (current) drug therapy: Secondary | ICD-10-CM | POA: Diagnosis not present

## 2016-02-14 DIAGNOSIS — J441 Chronic obstructive pulmonary disease with (acute) exacerbation: Secondary | ICD-10-CM | POA: Diagnosis not present

## 2016-02-14 DIAGNOSIS — D72829 Elevated white blood cell count, unspecified: Secondary | ICD-10-CM

## 2016-02-14 DIAGNOSIS — Z7952 Long term (current) use of systemic steroids: Secondary | ICD-10-CM | POA: Diagnosis not present

## 2016-02-14 LAB — COMPREHENSIVE METABOLIC PANEL
ALT: 23 U/L (ref 14–54)
ANION GAP: 18 — AB (ref 5–15)
AST: 24 U/L (ref 15–41)
Albumin: 4 g/dL (ref 3.5–5.0)
Alkaline Phosphatase: 60 U/L (ref 38–126)
BUN: 12 mg/dL (ref 6–20)
CALCIUM: 9.3 mg/dL (ref 8.9–10.3)
CO2: 43 mmol/L — AB (ref 22–32)
CREATININE: 0.68 mg/dL (ref 0.44–1.00)
Chloride: 74 mmol/L — ABNORMAL LOW (ref 101–111)
Glucose, Bld: 163 mg/dL — ABNORMAL HIGH (ref 65–99)
Potassium: 2.5 mmol/L — CL (ref 3.5–5.1)
SODIUM: 135 mmol/L (ref 135–145)
Total Bilirubin: 1 mg/dL (ref 0.3–1.2)
Total Protein: 7.4 g/dL (ref 6.5–8.1)

## 2016-02-14 LAB — CBC WITH DIFFERENTIAL/PLATELET
Basophils Absolute: 0 10*3/uL (ref 0.0–0.1)
Basophils Relative: 0 %
EOS ABS: 0.1 10*3/uL (ref 0.0–0.7)
Eosinophils Relative: 1 %
HCT: 45.1 % (ref 36.0–46.0)
HEMOGLOBIN: 14.2 g/dL (ref 12.0–15.0)
LYMPHS ABS: 0.9 10*3/uL (ref 0.7–4.0)
LYMPHS PCT: 6 %
MCH: 30.3 pg (ref 26.0–34.0)
MCHC: 31.5 g/dL (ref 30.0–36.0)
MCV: 96.4 fL (ref 78.0–100.0)
MONOS PCT: 3 %
Monocytes Absolute: 0.5 10*3/uL (ref 0.1–1.0)
Neutro Abs: 12.6 10*3/uL — ABNORMAL HIGH (ref 1.7–7.7)
Neutrophils Relative %: 90 %
Platelets: 259 10*3/uL (ref 150–400)
RBC: 4.68 MIL/uL (ref 3.87–5.11)
RDW: 13.7 % (ref 11.5–15.5)
WBC: 14.1 10*3/uL — ABNORMAL HIGH (ref 4.0–10.5)

## 2016-02-14 LAB — MAGNESIUM: MAGNESIUM: 1.9 mg/dL (ref 1.7–2.4)

## 2016-02-14 LAB — TROPONIN I: Troponin I: 0.03 ng/mL (ref <0.03)

## 2016-02-14 MED ORDER — METHYLPREDNISOLONE SODIUM SUCC 125 MG IJ SOLR
60.0000 mg | Freq: Four times a day (QID) | INTRAMUSCULAR | Status: DC
  Administered 2016-02-14 – 2016-02-15 (×3): 60 mg via INTRAVENOUS
  Filled 2016-02-14 (×3): qty 2

## 2016-02-14 MED ORDER — SODIUM CHLORIDE 0.9% FLUSH
3.0000 mL | Freq: Two times a day (BID) | INTRAVENOUS | Status: DC
  Administered 2016-02-14 – 2016-02-15 (×2): 3 mL via INTRAVENOUS

## 2016-02-14 MED ORDER — ENSURE ENLIVE PO LIQD: 237.0000 mL | Freq: Two times a day (BID) | ORAL | Status: DC

## 2016-02-14 MED ORDER — IPRATROPIUM-ALBUTEROL 0.5-2.5 (3) MG/3ML IN SOLN: 3.0000 mL | Freq: Three times a day (TID) | RESPIRATORY_TRACT | Status: DC

## 2016-02-14 MED ORDER — PANTOPRAZOLE SODIUM 40 MG PO TBEC
40.0000 mg | DELAYED_RELEASE_TABLET | Freq: Every day | ORAL | Status: DC
  Administered 2016-02-15: 40 mg via ORAL
  Filled 2016-02-14: qty 1

## 2016-02-14 MED ORDER — TIOTROPIUM BROMIDE MONOHYDRATE 18 MCG IN CAPS
18.0000 ug | ORAL_CAPSULE | Freq: Every day | RESPIRATORY_TRACT | Status: DC
  Administered 2016-02-15: 18 ug via RESPIRATORY_TRACT
  Filled 2016-02-14: qty 5

## 2016-02-14 MED ORDER — SODIUM CHLORIDE 0.9% FLUSH: 3.0000 mL | INTRAVENOUS | Status: DC | PRN

## 2016-02-14 MED ORDER — METHYLPREDNISOLONE SODIUM SUCC 125 MG IJ SOLR
125.0000 mg | Freq: Once | INTRAMUSCULAR | Status: AC
  Administered 2016-02-14: 125 mg via INTRAVENOUS
  Filled 2016-02-14: qty 2

## 2016-02-14 MED ORDER — IPRATROPIUM-ALBUTEROL 0.5-2.5 (3) MG/3ML IN SOLN
3.0000 mL | Freq: Once | RESPIRATORY_TRACT | Status: AC
  Administered 2016-02-14: 3 mL via RESPIRATORY_TRACT
  Filled 2016-02-14: qty 3

## 2016-02-14 MED ORDER — ALBUTEROL SULFATE (2.5 MG/3ML) 0.083% IN NEBU
2.5000 mg | INHALATION_SOLUTION | RESPIRATORY_TRACT | Status: DC | PRN
  Administered 2016-02-14: 2.5 mg via RESPIRATORY_TRACT
  Filled 2016-02-14: qty 3

## 2016-02-14 MED ORDER — ARFORMOTEROL TARTRATE 15 MCG/2ML IN NEBU: 15.0000 ug | INHALATION_SOLUTION | Freq: Two times a day (BID) | RESPIRATORY_TRACT | Status: DC

## 2016-02-14 MED ORDER — CETYLPYRIDINIUM CHLORIDE 0.05 % MT LIQD: 7.0000 mL | Freq: Two times a day (BID) | OROMUCOSAL | Status: DC

## 2016-02-14 MED ORDER — TRIAMTERENE-HCTZ 37.5-25 MG PO TABS
1.0000 | ORAL_TABLET | Freq: Every day | ORAL | Status: DC
  Administered 2016-02-15: 1 via ORAL
  Filled 2016-02-14: qty 1

## 2016-02-14 MED ORDER — POTASSIUM CHLORIDE 10 MEQ/100ML IV SOLN
10.0000 meq | INTRAVENOUS | Status: AC
  Administered 2016-02-14 (×3): 10 meq via INTRAVENOUS
  Filled 2016-02-14 (×3): qty 100

## 2016-02-14 MED ORDER — LEVOFLOXACIN 500 MG PO TABS
500.0000 mg | ORAL_TABLET | ORAL | Status: DC
  Administered 2016-02-14: 500 mg via ORAL
  Filled 2016-02-14: qty 1

## 2016-02-14 MED ORDER — ONDANSETRON HCL 4 MG/2ML IJ SOLN: 4.0000 mg | Freq: Four times a day (QID) | INTRAMUSCULAR | Status: DC | PRN

## 2016-02-14 MED ORDER — POTASSIUM CHLORIDE CRYS ER 20 MEQ PO TBCR
40.0000 meq | EXTENDED_RELEASE_TABLET | Freq: Once | ORAL | Status: AC
  Administered 2016-02-14: 40 meq via ORAL
  Filled 2016-02-14: qty 2

## 2016-02-14 MED ORDER — SODIUM CHLORIDE 0.9 % IV SOLN: 250.0000 mL | INTRAVENOUS | Status: DC | PRN

## 2016-02-14 MED ORDER — DOCUSATE SODIUM 100 MG PO CAPS
200.0000 mg | ORAL_CAPSULE | Freq: Every day | ORAL | Status: DC
  Administered 2016-02-14: 200 mg via ORAL
  Filled 2016-02-14: qty 2

## 2016-02-14 MED ORDER — IOPAMIDOL (ISOVUE-370) INJECTION 76%
100.0000 mL | Freq: Once | INTRAVENOUS | Status: AC | PRN
  Administered 2016-02-14: 100 mL via INTRAVENOUS

## 2016-02-14 MED ORDER — ADULT MULTIVITAMIN W/MINERALS CH
1.0000 | ORAL_TABLET | Freq: Every day | ORAL | Status: DC
  Administered 2016-02-15: 1 via ORAL
  Filled 2016-02-14: qty 1

## 2016-02-14 MED ORDER — POTASSIUM CHLORIDE CRYS ER 20 MEQ PO TBCR
40.0000 meq | EXTENDED_RELEASE_TABLET | ORAL | Status: AC
  Administered 2016-02-14 – 2016-02-15 (×3): 40 meq via ORAL
  Filled 2016-02-14 (×3): qty 2

## 2016-02-14 MED ORDER — ALPRAZOLAM 1 MG PO TABS
1.0000 mg | ORAL_TABLET | Freq: Three times a day (TID) | ORAL | Status: DC | PRN
  Administered 2016-02-15: 1 mg via ORAL
  Filled 2016-02-14: qty 1

## 2016-02-14 MED ORDER — ACETAMINOPHEN 650 MG RE SUPP: 650.0000 mg | Freq: Four times a day (QID) | RECTAL | Status: DC | PRN

## 2016-02-14 MED ORDER — SIMVASTATIN 20 MG PO TABS
40.0000 mg | ORAL_TABLET | Freq: Every day | ORAL | Status: DC
Start: 2016-02-14 — End: 2016-02-15
  Administered 2016-02-14: 40 mg via ORAL
  Filled 2016-02-14: qty 2

## 2016-02-14 MED ORDER — ENOXAPARIN SODIUM 40 MG/0.4ML ~~LOC~~ SOLN
40.0000 mg | SUBCUTANEOUS | Status: DC
  Administered 2016-02-14: 40 mg via SUBCUTANEOUS
  Filled 2016-02-14: qty 0.4

## 2016-02-14 MED ORDER — SODIUM CHLORIDE 0.9% FLUSH
3.0000 mL | Freq: Two times a day (BID) | INTRAVENOUS | Status: DC
  Administered 2016-02-14: 3 mL via INTRAVENOUS

## 2016-02-14 MED ORDER — ACETAMINOPHEN 325 MG PO TABS: 650.0000 mg | ORAL_TABLET | Freq: Four times a day (QID) | ORAL | Status: DC | PRN

## 2016-02-14 MED ORDER — POLYETHYLENE GLYCOL 3350 17 G PO PACK: 17.0000 g | PACK | Freq: Every day | ORAL | Status: DC | PRN

## 2016-02-14 MED ORDER — ONDANSETRON HCL 4 MG PO TABS: 4.0000 mg | ORAL_TABLET | Freq: Four times a day (QID) | ORAL | Status: DC | PRN

## 2016-02-14 NOTE — ED Notes (Signed)
C/o chest pain for last 2-3 days.  C/o of SOB and pain to mid chest rates pain 9/10.  Also c/o back pain.

## 2016-02-14 NOTE — ED Provider Notes (Signed)
CSN: [id number]     Arrival date & time 02/14/16  [id number] History   First MD Initiated Contact with Patient 02/14/16 [phone]     Chief Complaint  Patient presents with  . Chest Pain     (Consider location/radiation/quality/duration/timing/severity/associated sxs/prior Treatment) Patient is a 65 y.o. female presenting with shortness of breath. The history is provided by the patient. No language interpreter was used.  Shortness of Breath Severity:  Severe Onset quality:  Gradual Duration:  2 days Timing:  Constant Progression:  Worsening Chronicity:  Recurrent Context: activity   Relieved by:  Nothing Worsened by:  Nothing tried Ineffective treatments:  None tried Associated symptoms: chest pain   Associated symptoms: no abdominal pain   Risk factors: no hx of PE/DVT     Past Medical History  Diagnosis Date  . Hypertension   . Hyperlipemia   . COPD (chronic obstructive pulmonary disease) ([place])   . Anxiety   . Fibromyalgia   . Cancer [facility])    Past Surgical History  Procedure Laterality Date  . Mastectomy    . Abdominal hysterectomy    . Back surgery    . Breast surgery    . Breast surgery    . Tonsillectomy     Family History  Problem Relation Age of Onset  . Depression Maternal Aunt    Social History  Substance Use Topics  . Smoking status: Current Every Day Smoker -- 0.50 packs/day    Types: Cigarettes  . Smokeless tobacco: None  . Alcohol Use: No   OB History    No data available     Review of Systems  Respiratory: Positive for shortness of breath.   Cardiovascular: Positive for chest pain.  Gastrointestinal: Negative for abdominal pain.  All other systems reviewed and are negative.     Allergies  Erythromycin and Penicillins  Home Medications   Prior to Admission medications   Medication Sig Start Date End Date Taking? Authorizing Provider  albuterol (PROVENTIL HFA;VENTOLIN HFA) 108 (90 BASE) MCG/ACT inhaler Inhale 2 puffs into the lungs every 6  (six) hours as needed for wheezing or shortness of breath.    Historical Provider, MD  albuterol (PROVENTIL) (2.5 MG/3ML) 0.083% nebulizer solution Take 2.5 mg by nebulization every 6 (six) hours as needed. For shortness of breath    Historical Provider, MD  BROVANA 15 MCG/2ML NEBU Take 2 mLs by nebulization at bedtime.  05/01/13   Historical Provider, MD  DULoxetine (CYMBALTA) 60 MG capsule Take 1 capsule (60 mg total) by mouth daily. 06/29/14 06/29/15  Cloria Spring, MD  Multiple Vitamin (MULTIVITAMIN WITH MINERALS) TABS Take 1 tablet by mouth daily.    Historical Provider, MD  OLANZapine (ZYPREXA) 10 MG tablet Take 1 tablet (10 mg total) by mouth at bedtime. 06/29/14 06/29/15  Cloria Spring, MD  OLANZapine (ZYPREXA) 2.5 MG tablet Take 1 tablet (2.5 mg total) by mouth 3 (three) times daily. 06/29/14 06/29/15  Cloria Spring, MD  simvastatin (ZOCOR) 40 MG tablet Take 40 mg by mouth at bedtime.     Historical Provider, MD  tiotropium (SPIRIVA) 18 MCG inhalation capsule Place 1 capsule (18 mcg total) into inhaler and inhale daily. 09/04/12   Nimish Luther Parody, MD  triamterene-hydrochlorothiazide (MAXZIDE-25) 37.5-25 MG per tablet Take 1 each (1 tablet total) by mouth daily. 09/16/12   Lajean Saver, MD   BP 138/92 mmHg  Pulse 114  Temp(Src) 97.6 F (36.4 C) (Oral)  Resp 30  Ht 5\' 4"  (1.626 m)  Wt 58.968 kg  BMI 22.30 kg/m2  SpO2 95% Physical Exam  Constitutional: She is oriented to person, place, and time. She appears well-developed and well-nourished.  HENT:  Head: Normocephalic and atraumatic.  Eyes: Conjunctivae and EOM are normal. Pupils are equal, round, and reactive to light.  Neck: Normal range of motion.  Cardiovascular: Normal rate and normal heart sounds.   Pulmonary/Chest: She has wheezes. She exhibits tenderness.  Abdominal: Soft. She exhibits no distension.  Musculoskeletal: Normal range of motion.  Neurological: She is alert and oriented to person, place, and time.  Skin:  Skin is warm.  Psychiatric: She has a normal mood and affect.  Nursing note and vitals reviewed.   ED Course  Procedures (including critical care time) Labs Review Labs Reviewed  CBC WITH DIFFERENTIAL/PLATELET - Abnormal; Notable for the following:    WBC 14.1 (*)    Neutro Abs 12.6 (*)    All other components within normal limits  COMPREHENSIVE METABOLIC PANEL    Imaging Review No results found. I have personally reviewed and evaluated these images and lab results as part of my medical decision-making.   EKG Interpretation   Date/Time:  Friday February 14 2016 09:51:39 EDT Ventricular Rate:  112 PR Interval:    QRS Duration: 101 QT Interval:  334 QTC Calculation: 456 R Axis:   79 Text Interpretation:  Sinus tachycardia Biatrial enlargement Minimal ST  depression, inferior leads Baseline wander in lead(s) V6 Confirmed by  [facility] MD, Corene Cornea [id number]) on 02/14/2016 10:02:49 AM      MDM Pt is on 5 liters 02 at home.  Rn increased to 6 liters and 02 and pulse ox increased to 95%.  Pt given duoneb and solumedrol.   Pt reports some relief.  Troponin 0.03    Final diagnoses:  COPD exacerbation ([place])  Hypokalemia   I Dr. Jerilee Hoh triad hospitalist will admit for evaluation and further treatment.    Fransico Meadow, PA-C 02/14/16 [place], PA-C 02/14/16 [place], MD 02/14/16 [phone]

## 2016-02-14 NOTE — ED Provider Notes (Signed)
Medical screening examination/treatment/procedure(s) were conducted as a shared visit with non-physician practitioner(s) and myself.  I personally evaluated the patient during the encounter.   65 year old female a history of severe COPd on 5 L of oxygen at hom is hre h increasing respirations. On initial arriva patient was tachycardic tachypnea, hypoxic even on her 5L and in mild respiratoryu distress.  She had diffuse decreased breath sounds and wheezing, no significant rhonchi and still tachycardic on my exam. Patient likely cOPD exacerbation versus possible pulmonary embolus versus pneumonia. We'll teat appropriately and will likely need admssion.    EKG Interpretation  Date/Time:  Friday February 14 2016 09:51:39 EDT Ventricular Rate:  112 PR Interval:    QRS Duration: 101 QT Interval:  334 QTC Calculation: 456 R Axis:   79 Text Interpretation:  Sinus tachycardia Biatrial enlargement Minimal ST depression, inferior leads Baseline wander in lead(s) V6 Confirmed by [facility] MD, Izsak Meir [phone]) on 02/14/2016 10:02:49 AM        Merrily Pew, MD 02/14/16 1428

## 2016-02-14 NOTE — ED Notes (Signed)
Respiratory at bedside.

## 2016-02-14 NOTE — H&P (Signed)
History and Physical    ERIKKA ZOUCHA [id number] DOB: [birth year] DOA: 02/14/2016  Referring MD/NP/PA: Marcene Brawn, EDPA PCP: No PCP Per Patient  Patient coming from: Home  Chief Complaint: Shortness of breath, chest pain  HPI: Allison Park is a 65 y.o. female with history of COPD who presents to the hospital with the above complaints. She states that over the past 24 hours she has become increasingly short of breath and has been having some chest pain. Chest pain is over her left breast and is worse with deep inspiration is nonradiating. She has not had diaphoresis, lightheadedness or palpitations. Upon arrival to the ED she was found to have significant wheezing, that did not improve after several breathing treatments, vital signs were within normal limits with exception of tachycardia and troponin was slightly elevated at 0.03, WBC count 14.1, potassium 2.5. We have been asked to admit her for further evaluation and management.  Past Medical/Surgical History: Past Medical History  Diagnosis Date  . Hypertension   . Hyperlipemia   . COPD (chronic obstructive pulmonary disease) ([place])   . Anxiety   . Fibromyalgia   . Cancer [facility])     Past Surgical History  Procedure Laterality Date  . Mastectomy    . Abdominal hysterectomy    . Back surgery    . Breast surgery    . Breast surgery    . Tonsillectomy      Social History:  reports that she has been smoking Cigarettes.  She has been smoking about 0.50 packs per day. She does not have any smokeless tobacco history on file. She reports that she does not drink alcohol or use illicit drugs.  Allergies: Allergies  Allergen Reactions  . Erythromycin Other (See Comments)    unknown  . Penicillins Hives and Swelling    Family History:  Family History  Problem Relation Age of Onset  . Depression Maternal Aunt     Prior to Admission medications   Medication Sig Start Date End Date Taking? Authorizing Provider  albuterol  (PROVENTIL HFA;VENTOLIN HFA) 108 (90 BASE) MCG/ACT inhaler Inhale 2 puffs into the lungs every 6 (six) hours as needed for wheezing or shortness of breath.   Yes Historical Provider, MD  albuterol (PROVENTIL) (2.5 MG/3ML) 0.083% nebulizer solution Take 2.5 mg by nebulization every 6 (six) hours as needed. For shortness of breath   Yes Historical Provider, MD  ALPRAZolam Duanne Moron) 1 MG tablet Take 1 mg by mouth 3 (three) times daily as needed for anxiety.   Yes Historical Provider, MD  docusate sodium (COLACE) 100 MG capsule Take 200 mg by mouth at bedtime.   Yes Historical Provider, MD  Multiple Vitamin (MULTIVITAMIN WITH MINERALS) TABS Take 1 tablet by mouth daily.   Yes Historical Provider, MD  omeprazole (PRILOSEC) 20 MG capsule Take 20 mg by mouth daily.   Yes Historical Provider, MD  polyethylene glycol (MIRALAX / GLYCOLAX) packet Take 17 g by mouth daily as needed for mild constipation.   Yes Historical Provider, MD  predniSONE (DELTASONE) 10 MG tablet Take 30 mg by mouth daily. 02/02/16  Yes Historical Provider, MD  simvastatin (ZOCOR) 40 MG tablet Take 40 mg by mouth at bedtime.    Yes Historical Provider, MD  tiotropium (SPIRIVA) 18 MCG inhalation capsule Place 1 capsule (18 mcg total) into inhaler and inhale daily. 09/04/12  Yes Nimish Luther Parody, MD  triamterene-hydrochlorothiazide (MAXZIDE-25) 37.5-25 MG per tablet Take 1 each (1 tablet total) by mouth daily. 09/16/12  Yes Lajean Saver, MD  BROVANA 15 MCG/2ML NEBU Take 2 mLs by nebulization at bedtime.  05/01/13   Historical Provider, MD    Review of Systems:  Constitutional: Denies fever, chills, diaphoresis, appetite change and fatigue.  HEENT: Denies photophobia, eye pain, redness, hearing loss, ear pain, congestion, sore throat, rhinorrhea, sneezing, mouth sores, trouble swallowing, neck pain, neck stiffness and tinnitus.   Respiratory: Positive for SOB, DOE, cough, chest tightness,  and wheezing.   Cardiovascular: Denies  palpitations and  leg swelling.  Gastrointestinal: Denies nausea, vomiting, abdominal pain, diarrhea, constipation, blood in stool and abdominal distention.  Genitourinary: Denies dysuria, urgency, frequency, hematuria, flank pain and difficulty urinating.  Endocrine: Denies: hot or cold intolerance, sweats, changes in hair or nails, polyuria, polydipsia. Musculoskeletal: Denies myalgias, back pain, joint swelling, arthralgias and gait problem.  Skin: Denies pallor, rash and wound.  Neurological: Denies dizziness, seizures, syncope, weakness, light-headedness, numbness and headaches.  Hematological: Denies adenopathy. Easy bruising, personal or family bleeding history  Psychiatric/Behavioral: Denies suicidal ideation, mood changes, confusion, nervousness, sleep disturbance and agitation    Physical Exam: Filed Vitals:   02/14/16 1430 02/14/16 1500 02/14/16 1530 02/14/16 1642  BP: 105/76 133/85 126/81 95/69  Pulse: 101 99  112  Temp:    98.6 F (37 C)  TempSrc:    Oral  Resp: 27 24 17    Height:    5\' 4"  (1.626 m)  Weight:    58.968 kg (130 lb)  SpO2: 93% 95%  91%     Constitutional: NAD, calm, conversational Eyes: PERRL, lids and conjunctivae normal ENMT: Mucous membranes are moist. Posterior pharynx clear of any exudate or lesions.Normal dentition.  Neck: normal, supple, no masses, no thyromegaly Respiratory: Fair air movement, mild expiratory wheezing  Cardiovascular: Regular rate and rhythm, no murmurs / rubs / gallops. No extremity edema. 2+ pedal pulses. No carotid bruits.  Abdomen: no tenderness, no masses palpated. No hepatosplenomegaly. Bowel sounds positive.  Musculoskeletal: no clubbing / cyanosis. No joint deformity upper and lower extremities. Good ROM, no contractures. Normal muscle tone.  Skin: no rashes, lesions, ulcers. No induration Neurologic: CN 2-12 grossly intact. Sensation intact, DTR normal. Strength 5/5 in all 4.  Psychiatric: Normal judgment and insight. Alert and oriented  x 3. Normal mood.    Labs on Admission: I have personally reviewed the following labs and imaging studies  CBC:  Recent Labs Lab 02/14/16 1004  WBC 14.1*  NEUTROABS 12.6*  HGB 14.2  HCT 45.1  MCV 96.4  PLT [id number]   Basic Metabolic Panel:  Recent Labs Lab 02/14/16 1004  NA 135  K 2.5*  CL 74*  CO2 43*  GLUCOSE 163*  BUN 12  CREATININE 0.68  CALCIUM 9.3   GFR: Estimated Creatinine Clearance: 62.2 mL/min (by C-G formula based on Cr of 0.68). Liver Function Tests:  Recent Labs Lab 02/14/16 1004  AST 24  ALT 23  ALKPHOS 60  BILITOT 1.0  PROT 7.4  ALBUMIN 4.0   No results for input(s): LIPASE, AMYLASE in the last 168 hours. No results for input(s): AMMONIA in the last 168 hours. Coagulation Profile: No results for input(s): INR, PROTIME in the last 168 hours. Cardiac Enzymes:  Recent Labs Lab 02/14/16 1325  TROPONINI 0.03*   BNP (last 3 results) No results for input(s): PROBNP in the last 8760 hours. HbA1C: No results for input(s): HGBA1C in the last 72 hours. CBG: No results for input(s): GLUCAP in the last 168 hours. Lipid Profile: No results for input(s):  CHOL, HDL, LDLCALC, TRIG, CHOLHDL, LDLDIRECT in the last 72 hours. Thyroid Function Tests: No results for input(s): TSH, T4TOTAL, FREET4, T3FREE, THYROIDAB in the last 72 hours. Anemia Panel: No results for input(s): VITAMINB12, FOLATE, FERRITIN, TIBC, IRON, RETICCTPCT in the last 72 hours. Urine analysis:    Component Value Date/Time   COLORURINE YELLOW 04/18/2014 1324   APPEARANCEUR CLEAR 04/18/2014 1324   LABSPEC <1.005* 04/18/2014 1324   PHURINE 5.5 04/18/2014 1324   GLUCOSEU NEGATIVE 04/18/2014 1324   HGBUR NEGATIVE 04/18/2014 1324   BILIRUBINUR NEGATIVE 04/18/2014 1324   KETONESUR NEGATIVE 04/18/2014 1324   PROTEINUR NEGATIVE 04/18/2014 1324   UROBILINOGEN 0.2 04/18/2014 1324   NITRITE NEGATIVE 04/18/2014 1324   LEUKOCYTESUR NEGATIVE 04/18/2014 1324   Sepsis  Labs: @LABRCNTIP (procalcitonin:4,lacticidven:4) )No results found for this or any previous visit (from the past 240 hour(s)).   Radiological Exams on Admission: Dg Chest 2 View  02/14/2016  CLINICAL DATA:  Chronic chest congestion in discomfort; history of shortness of breath, COPD, current smoker, breast malignancy. EXAM: CHEST  2 VIEW COMPARISON:  Portable chest x-ray of April 18, 2014 FINDINGS: The lungs remain markedly hyperinflated with hemidiaphragm flattening and increased AP dimension of the thorax. There is no alveolar infiltrate nor pleural effusion. The heart and pulmonary vascularity are normal. The mediastinum is normal in width. There is no pleural effusion or pneumothorax. The bony thorax exhibits no acute abnormality. Bilateral saline breast implants are present. IMPRESSION: COPD. No pneumonia nor CHF. No evidence of metastatic disease to the lungs. Electronically Signed   By: David  Martinique M.D.   On: 02/14/2016 10:54   Ct Angio Chest Pe W Or Wo Contrast  02/14/2016  CLINICAL DATA:  Cp and sob x 2-3 days; eval for pe; no other complaints EXAM: CT ANGIOGRAPHY CHEST WITH CONTRAST TECHNIQUE: Multidetector CT imaging of the chest was performed using the standard protocol during bolus administration of intravenous contrast. Multiplanar CT image reconstructions and MIPs were obtained to evaluate the vascular anatomy. CONTRAST:  100 mL Isovue 370 IV COMPARISON:  09/03/2012 FINDINGS: Vascular: Left arm IV contrast administration. Innominate vein and SVC are patent. Right atrium and right ventricle are nondilated. Satisfactory opacification of pulmonary arteries noted, and there is no evidence of pulmonary emboli. Breathing motion during the acquisition degrades some of the images. Patent bilateral pulmonary veins drain into the left atrium. Scattered coronary calcifications. Adequate contrast opacification of the thoracic aorta with no evidence of dissection, aneurysm, or stenosis. There is  classic 3-vessel brachiocephalic arch anatomy without proximal stenosis. Scattered plaque in the arch and descending thoracic aorta. Moderate plaque in the visualized proximal abdominal aorta. Mediastinum/Lymph Nodes: No masses or pathologically enlarged lymph nodes identified. No pericardial effusion. Lungs/Pleura: Advanced pulmonary emphysema. Right middle lobe collapse. Platelike atelectasis medially in the anterior right upper lobe and lingula. Stable 5 mm pleural-based nodule, posterior right upper lobe image 55/8 Upper abdomen: No acute findings.  Stable left adrenal nodule. Musculoskeletal: Cervical fixation hardware, incompletely visualized. Thoracic spine and sternum intact. Bilateral breast prostheses. Review of the MIP images confirms the above findings. IMPRESSION: 1. Negative for acute PE or thoracic aortic dissection. 2. 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 3. Atherosclerosis, including Aortic Atherosclerosis (ICD10-170.0) and coronary artery disease. Please note that although the presence of coronary artery calcium documents the presence of coronary artery disease, the severity of this disease and any potential stenosis cannot be assessed on this non-gated CT examination. Assessment for potential risk factor modification, dietary therapy or pharmacologic therapy may  be warranted, if clinically indicated. Electronically Signed   By: Lucrezia Europe M.D.   On: 02/14/2016 12:09    EKG: Independently reviewed. Sinus tachycardia at a rate of 106, normal axis, no acute ischemic abnormalities  Assessment/Plan Principal Problem:   COPD exacerbation (HCC) Active Problems:   HTN (hypertension)   Pulmonary fibrosis (HCC)   Acute on chronic respiratory failure (HCC)   Hypokalemia   Leukocytosis    Acute on chronic hypoxemic respiratory failure -On account of COPD with acute exacerbation and pulmonary fibrosis. -Continue oxygen as needed, nebs, steroids,  Levaquin.  Hypokalemia -Replete orally, check magnesium.  Leukocytosis -Likely due to acute COPD as well as chronic steroid use.   DVT prophylaxis:  Lovenox  Code Status:  Full code  Family Communication:  Patient only  Disposition Plan:  Hope for discharge home in 48 hours  Consults called:  None  Admission status:  Observation    Time Spent:  75 minutes  Lelon Frohlich MD [facility] Pager [phone]  If 7PM-7AM, please contact night-coverage [email or web address] Password [email or web address]  02/14/2016, 6:04 PM

## 2016-02-14 NOTE — ED Notes (Signed)
CRITICAL VALUE ALERT  Critical value received:  K 2.5 and c02 43 Date of notification:  02/14/16  Time of notification:  1003  Critical value read back:Yes.    Nurse who received alert:  Rae Mar  MD notified (1st page): Dr. Dayna Barker and Marcene Brawn, pa

## 2016-02-15 DIAGNOSIS — F17210 Nicotine dependence, cigarettes, uncomplicated: Secondary | ICD-10-CM | POA: Diagnosis not present

## 2016-02-15 DIAGNOSIS — D72829 Elevated white blood cell count, unspecified: Secondary | ICD-10-CM | POA: Diagnosis not present

## 2016-02-15 DIAGNOSIS — F419 Anxiety disorder, unspecified: Secondary | ICD-10-CM | POA: Diagnosis not present

## 2016-02-15 DIAGNOSIS — Z7952 Long term (current) use of systemic steroids: Secondary | ICD-10-CM | POA: Diagnosis not present

## 2016-02-15 DIAGNOSIS — Z881 Allergy status to other antibiotic agents status: Secondary | ICD-10-CM | POA: Diagnosis not present

## 2016-02-15 DIAGNOSIS — E785 Hyperlipidemia, unspecified: Secondary | ICD-10-CM | POA: Diagnosis not present

## 2016-02-15 DIAGNOSIS — I10 Essential (primary) hypertension: Secondary | ICD-10-CM | POA: Diagnosis not present

## 2016-02-15 DIAGNOSIS — E876 Hypokalemia: Secondary | ICD-10-CM | POA: Diagnosis present

## 2016-02-15 DIAGNOSIS — Z79899 Other long term (current) drug therapy: Secondary | ICD-10-CM | POA: Diagnosis not present

## 2016-02-15 DIAGNOSIS — J8410 Pulmonary fibrosis, unspecified: Secondary | ICD-10-CM | POA: Diagnosis not present

## 2016-02-15 DIAGNOSIS — J441 Chronic obstructive pulmonary disease with (acute) exacerbation: Secondary | ICD-10-CM | POA: Diagnosis not present

## 2016-02-15 DIAGNOSIS — M797 Fibromyalgia: Secondary | ICD-10-CM | POA: Diagnosis not present

## 2016-02-15 DIAGNOSIS — J9621 Acute and chronic respiratory failure with hypoxia: Secondary | ICD-10-CM | POA: Diagnosis not present

## 2016-02-15 DIAGNOSIS — Z880 Allergy status to penicillin: Secondary | ICD-10-CM | POA: Diagnosis not present

## 2016-02-15 LAB — CBC
HCT: 43.9 % (ref 36.0–46.0)
HEMOGLOBIN: 13.6 g/dL (ref 12.0–15.0)
MCH: 29.8 pg (ref 26.0–34.0)
MCHC: 31 g/dL (ref 30.0–36.0)
MCV: 96.3 fL (ref 78.0–100.0)
Platelets: 237 10*3/uL (ref 150–400)
RBC: 4.56 MIL/uL (ref 3.87–5.11)
RDW: 13.7 % (ref 11.5–15.5)
WBC: 11.7 10*3/uL — AB (ref 4.0–10.5)

## 2016-02-15 LAB — TROPONIN I

## 2016-02-15 LAB — BASIC METABOLIC PANEL
ANION GAP: 14 (ref 5–15)
BUN: 16 mg/dL (ref 6–20)
CALCIUM: 9.2 mg/dL (ref 8.9–10.3)
CO2: 40 mmol/L — AB (ref 22–32)
Chloride: 84 mmol/L — ABNORMAL LOW (ref 101–111)
Creatinine, Ser: 0.61 mg/dL (ref 0.44–1.00)
Glucose, Bld: 154 mg/dL — ABNORMAL HIGH (ref 65–99)
Potassium: 3.9 mmol/L (ref 3.5–5.1)
SODIUM: 138 mmol/L (ref 135–145)

## 2016-02-15 MED ORDER — PREDNISONE 10 MG PO TABS: 10.0000 mg | ORAL_TABLET | Freq: Every day | ORAL | Status: AC

## 2016-02-15 MED ORDER — LEVOFLOXACIN 500 MG PO TABS: 500.0000 mg | ORAL_TABLET | ORAL | Status: AC

## 2016-02-15 MED ORDER — GLUCERNA SHAKE PO LIQD
237.0000 mL | Freq: Two times a day (BID) | ORAL | Status: DC
  Administered 2016-02-15: 237 mL via ORAL

## 2016-02-15 MED ORDER — ARFORMOTEROL TARTRATE 15 MCG/2ML IN NEBU
15.0000 ug | INHALATION_SOLUTION | Freq: Two times a day (BID) | RESPIRATORY_TRACT | Status: DC
Start: 2016-02-15 — End: 2016-02-15
  Administered 2016-02-15: 15 ug via RESPIRATORY_TRACT
  Filled 2016-02-15: qty 2

## 2016-02-15 NOTE — Discharge Summary (Signed)
Physician Discharge Summary  Allison Park [id number] DOB: 08-17-1951 DOA: 02/14/2016  PCP: No PCP Per Patient  Admit date: 02/14/2016 Discharge date: 02/15/2016  Time spent: 45 minutes  Recommendations for Outpatient Follow-up:  -We'll be discharged home today. -Advised to follow-up with primary care provider in 2 weeks.   Discharge Diagnoses:  Principal Problem:   COPD exacerbation ([place]) Active Problems:   HTN (hypertension)   Pulmonary fibrosis (HCC)   Acute on chronic respiratory failure (HCC)   Hypokalemia   Leukocytosis   Discharge Condition: Stable and improved  Filed Weights   02/14/16 0956 02/14/16 1642  Weight: 58.968 kg (130 lb) 58.968 kg (130 lb)    History of present illness:  Allison Park is a 65 y.o. female with history of COPD who presents to the hospital with the above complaints. She states that over the past 24 hours she has become increasingly short of breath and has been having some chest pain. Chest pain is over her left breast and is worse with deep inspiration is nonradiating. She has not had diaphoresis, lightheadedness or palpitations. Upon arrival to the ED she was found to have significant wheezing, that did not improve after several breathing treatments, vital signs were within normal limits with exception of tachycardia and troponin was slightly elevated at 0.03, WBC count 14.1, potassium 2.5. We have been asked to admit her for further evaluation and management.  Hospital Course:   Acute on chronic hypoxemic respiratory failure -On account of COPD with acute exacerbation and pulmonary fibrosis. -Today patient states he is back at her baseline, continue oxygen, nebs, prednisone taper and Levaquin for 4 more days.  Hypokalemia -Replaced.  Leukocytosis -Improving  Procedures:  None   Consultations:  None  Discharge Instructions  Discharge Instructions    Diet - low sodium heart healthy    Complete by:  As directed      Increase activity slowly    Complete by:  As directed             Medication List    TAKE these medications        albuterol (2.5 MG/3ML) 0.083% nebulizer solution  Commonly known as:  PROVENTIL  Take 2.5 mg by nebulization every 6 (six) hours as needed. For shortness of breath     albuterol 108 (90 Base) MCG/ACT inhaler  Commonly known as:  PROVENTIL HFA;VENTOLIN HFA  Inhale 2 puffs into the lungs every 6 (six) hours as needed for wheezing or shortness of breath.     ALPRAZolam 1 MG tablet  Commonly known as:  XANAX  Take 1 mg by mouth 3 (three) times daily as needed for anxiety.     BROVANA 15 MCG/2ML Nebu  Generic drug:  arformoterol  Take 2 mLs by nebulization at bedtime.     docusate sodium 100 MG capsule  Commonly known as:  COLACE  Take 200 mg by mouth at bedtime.     levofloxacin 500 MG tablet  Commonly known as:  LEVAQUIN  Take 1 tablet (500 mg total) by mouth daily.     multivitamin with minerals Tabs tablet  Take 1 tablet by mouth daily.     omeprazole 20 MG capsule  Commonly known as:  PRILOSEC  Take 20 mg by mouth daily.     polyethylene glycol packet  Commonly known as:  MIRALAX / GLYCOLAX  Take 17 g by mouth daily as needed for mild constipation.     predniSONE 10 MG tablet  Commonly known as:  DELTASONE  Take 30 mg by mouth daily.     predniSONE 10 MG tablet  Commonly known as:  DELTASONE  Take 1 tablet (10 mg total) by mouth daily with breakfast. Take 6 tablets today and then decrease by 1 tablet daily until none are left.     simvastatin 40 MG tablet  Commonly known as:  ZOCOR  Take 40 mg by mouth at bedtime.     tiotropium 18 MCG inhalation capsule  Commonly known as:  SPIRIVA  Place 1 capsule (18 mcg total) into inhaler and inhale daily.     triamterene-hydrochlorothiazide 37.5-25 MG tablet  Commonly known as:  MAXZIDE-25  Take 1 each (1 tablet total) by mouth daily.       Allergies  Allergen Reactions  . Erythromycin Other (See  Comments)    unknown  . Penicillins Hives and Swelling      The results of significant diagnostics from this hospitalization (including imaging, microbiology, ancillary and laboratory) are listed below for reference.    Significant Diagnostic Studies: Dg Chest 2 View  02/14/2016  CLINICAL DATA:  Chronic chest congestion in discomfort; history of shortness of breath, COPD, current smoker, breast malignancy. EXAM: CHEST  2 VIEW COMPARISON:  Portable chest x-ray of April 18, 2014 FINDINGS: The lungs remain markedly hyperinflated with hemidiaphragm flattening and increased AP dimension of the thorax. There is no alveolar infiltrate nor pleural effusion. The heart and pulmonary vascularity are normal. The mediastinum is normal in width. There is no pleural effusion or pneumothorax. The bony thorax exhibits no acute abnormality. Bilateral saline breast implants are present. IMPRESSION: COPD. No pneumonia nor CHF. No evidence of metastatic disease to the lungs. Electronically Signed   By: David  Martinique M.D.   On: 02/14/2016 10:54   Ct Angio Chest Pe W Or Wo Contrast  02/14/2016  CLINICAL DATA:  Cp and sob x 2-3 days; eval for pe; no other complaints EXAM: CT ANGIOGRAPHY CHEST WITH CONTRAST TECHNIQUE: Multidetector CT imaging of the chest was performed using the standard protocol during bolus administration of intravenous contrast. Multiplanar CT image reconstructions and MIPs were obtained to evaluate the vascular anatomy. CONTRAST:  100 mL Isovue 370 IV COMPARISON:  09/03/2012 FINDINGS: Vascular: Left arm IV contrast administration. Innominate vein and SVC are patent. Right atrium and right ventricle are nondilated. Satisfactory opacification of pulmonary arteries noted, and there is no evidence of pulmonary emboli. Breathing motion during the acquisition degrades some of the images. Patent bilateral pulmonary veins drain into the left atrium. Scattered coronary calcifications. Adequate contrast  opacification of the thoracic aorta with no evidence of dissection, aneurysm, or stenosis. There is classic 3-vessel brachiocephalic arch anatomy without proximal stenosis. Scattered plaque in the arch and descending thoracic aorta. Moderate plaque in the visualized proximal abdominal aorta. Mediastinum/Lymph Nodes: No masses or pathologically enlarged lymph nodes identified. No pericardial effusion. Lungs/Pleura: Advanced pulmonary emphysema. Right middle lobe collapse. Platelike atelectasis medially in the anterior right upper lobe and lingula. Stable 5 mm pleural-based nodule, posterior right upper lobe image 55/8 Upper abdomen: No acute findings.  Stable left adrenal nodule. Musculoskeletal: Cervical fixation hardware, incompletely visualized. Thoracic spine and sternum intact. Bilateral breast prostheses. Review of the MIP images confirms the above findings. IMPRESSION: 1. Negative for acute PE or thoracic aortic dissection. 2. 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 3. Atherosclerosis, including Aortic Atherosclerosis (ICD10-170.0) and coronary artery disease. Please note that although the presence of coronary artery calcium documents the presence of  coronary artery disease, the severity of this disease and any potential stenosis cannot be assessed on this non-gated CT examination. Assessment for potential risk factor modification, dietary therapy or pharmacologic therapy may be warranted, if clinically indicated. Electronically Signed   By: Lucrezia Europe M.D.   On: 02/14/2016 12:09    Microbiology: No results found for this or any previous visit (from the past 240 hour(s)).   Labs: Basic Metabolic Panel:  Recent Labs Lab 02/14/16 1004 02/14/16 1859 02/15/16 0652  NA 135  --  138  K 2.5*  --  3.9  CL 74*  --  84*  CO2 43*  --  40*  GLUCOSE 163*  --  154*  BUN 12  --  16  CREATININE 0.68  --  0.61  CALCIUM 9.3  --  9.2  MG  --  1.9  --    Liver Function Tests:  Recent Labs Lab  02/14/16 1004  AST 24  ALT 23  ALKPHOS 60  BILITOT 1.0  PROT 7.4  ALBUMIN 4.0   No results for input(s): LIPASE, AMYLASE in the last 168 hours. No results for input(s): AMMONIA in the last 168 hours. CBC:  Recent Labs Lab 02/14/16 1004 02/15/16 0652  WBC 14.1* 11.7*  NEUTROABS 12.6*  --   HGB 14.2 13.6  HCT 45.1 43.9  MCV 96.4 96.3  PLT 259 237   Cardiac Enzymes:  Recent Labs Lab 02/14/16 1325 02/14/16 1859 02/15/16 0054 02/15/16 0652  TROPONINI 0.03* <0.03 <0.03 <0.03   BNP: BNP (last 3 results) No results for input(s): BNP in the last 8760 hours.  ProBNP (last 3 results) No results for input(s): PROBNP in the last 8760 hours.  CBG: No results for input(s): GLUCAP in the last 168 hours.     SignedLelon Frohlich  [facility] Pager: [phone] 02/15/2016, 11:34 AM

## 2016-02-15 NOTE — Progress Notes (Signed)
Patient states understanding of discharge instructions, prescriptions given 

## 2016-02-15 NOTE — Progress Notes (Signed)
Initial Nutrition Assessment  DOCUMENTATION CODES:  Not applicable  INTERVENTION:  Given hyperglycemia and use of steroids switch to Glucerna Shake po BID, each supplement provides 220 kcal and 10 grams of protein  NUTRITION DIAGNOSIS:  Increased nutrient needs related to chronic illness & current Medications as evidenced by the nutritional recommendations for these circumstances  GOAL:  Patient will meet greater than or equal to 90% of their needs  MONITOR:  PO intake, Supplement acceptance, Labs  REASON FOR ASSESSMENT:  Malnutrition Screening Tool    ASSESSMENT:  65 y/o female PMHx COPD, Anxiety, HTN, HLD, Cancer. Presents with increasing SOB and CP. In ED found to have hypokalemia and SOB that did not improve with breathing treatment. Admitted for COPD exacerbation.   RD operating remotely. Pt triggered on MST for weight loss and poor appetite.   Per chart review, there is no direct mention of any wt loss or poor PO intake. However, it is very likely she was not eating at her baseline during this episode of respiratory failure which had been going for for 24 hrs.   Her weight history appears to be mostly reported weights. From the information presented, she has gained weight over the past couple years. No recent weight loss seen in EMR documentation or discussed in chart notes.   Relevant Meds: Maxzide, methyl prednisone, mvi with min, ensure, colace  Labs reviewed:   Recent Labs Lab 02/14/16 1004 02/14/16 1859 02/15/16 0652  NA 135  --  138  K 2.5*  --  3.9  CL 74*  --  84*  CO2 43*  --  40*  BUN 12  --  16  CREATININE 0.68  --  0.61  CALCIUM 9.3  --  9.2  MG  --  1.9  --   GLUCOSE 163*  --  154*   Diet Order:  Diet Heart Room service appropriate?: Yes; Fluid consistency:: Thin  Skin:  Reviewed, no issues  Last BM:  6/30  Height:  Ht Readings from Last 1 Encounters:  02/14/16 5\' 4"  (1.626 m)   Weight:  Wt Readings from Last 1 Encounters:  02/14/16 130  lb (58.968 kg)   Wt Readings from Last 10 Encounters:  02/14/16 130 lb (58.968 kg)  06/29/14 106 lb 6.4 oz (48.263 kg)  04/18/14 110 lb (49.896 kg)  02/19/14 109 lb (49.442 kg)  02/03/14 115 lb (52.164 kg)  01/22/14 110 lb (49.896 kg)  12/28/13 124 lb (56.246 kg)  10/19/13 132 lb (59.875 kg)  09/08/13 131 lb (59.421 kg)  06/25/13 140 lb (63.504 kg)   Ideal Body Weight:  54.55 kg  BMI:  Body mass index is 22.3 kg/(m^2).  Estimated Nutritional Needs:  Kcal:  1650-1850 (28-31 kcal/kg bw) Protein:  65-75 G (1.1-1.3 g/kg bw) Fluid:  >1.8 liters  EDUCATION NEEDS:  No education needs identified at this time  Burtis Junes RD, LDN, [place] [facility] Pager: [phone] 02/15/2016 8:15 AM

## 2016-02-15 NOTE — Progress Notes (Signed)
Patient takes albuterol nebs but she states they make her very nervous and she does not take unless really has too. She is in with COPD exacerbation. Have placed on [place] nebs, bid. Potassium is low. Hope is that the LABA will hold her wheezes and only need albuterol in extreme cases.

## 2017-02-16 NOTE — Care Management (Signed)
Cm received call from [facility] nurse. Rn concerned about pt's DC, brother reports pt will be DC'd from hospital today. Verdis Frederickson made aware pt not in hospital and [place] to home Sunday.

## 2017-03-17 DEATH — deceased
